# Patient Record
Sex: Female | Born: 2000 | Race: Black or African American | Hispanic: No | Marital: Single | Smoking: Never smoker
Health system: Southern US, Community
[De-identification: ages and names within clinical notes are randomized; demographics above are authoritative.]

## PROBLEM LIST (undated history)

## (undated) DIAGNOSIS — Z789 Other specified health status: Secondary | ICD-10-CM

## (undated) HISTORY — DX: Other specified health status: Z78.9

## (undated) HISTORY — PX: NO PAST SURGERIES: SHX2092

---
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
## 2020-05-31 ENCOUNTER — Encounter: Payer: Self-pay | Admitting: Emergency Medicine

## 2020-05-31 ENCOUNTER — Emergency Department
Admission: EM | Admit: 2020-05-31 | Discharge: 2020-05-31 | Disposition: A | Discharge status: Home / Self Care | Payer: Medicaid Other | Attending: Emergency Medicine | Admitting: Emergency Medicine

## 2020-05-31 ENCOUNTER — Other Ambulatory Visit: Payer: Self-pay

## 2020-05-31 ENCOUNTER — Emergency Department: Payer: Medicaid Other

## 2020-05-31 DIAGNOSIS — O26892 Other specified pregnancy related conditions, second trimester: Secondary | ICD-10-CM | POA: Diagnosis not present

## 2020-05-31 DIAGNOSIS — Z3A14 14 weeks gestation of pregnancy: Secondary | ICD-10-CM

## 2020-05-31 DIAGNOSIS — R102 Pelvic and perineal pain unspecified side: Secondary | ICD-10-CM

## 2020-05-31 LAB — BASIC METABOLIC PANEL
Anion gap: 10 (ref 5–15)
BUN: <5 mg/dL — ABNORMAL LOW (ref 6–20)
CO2: 21 mmol/L — ABNORMAL LOW (ref 22–32)
Calcium: 9.5 mg/dL (ref 8.9–10.3)
Chloride: 106 mmol/L (ref 98–111)
Creatinine, Ser: 0.51 mg/dL (ref 0.44–1.00)
GFR, Estimated: >60 mL/min (ref >60)
Glucose, Bld: 93 mg/dL (ref 70–99)
Potassium: 3.6 mmol/L (ref 3.5–5.1)
Sodium: 137 mmol/L (ref 135–145)

## 2020-05-31 LAB — CBC
HCT: 39.1 % (ref 36.0–46.0)
Hemoglobin: 13.4 g/dL (ref 12.0–15.0)
MCH: 28.3 pg (ref 26.0–34.0)
MCHC: 34.3 g/dL (ref 30.0–36.0)
MCV: 82.7 fL (ref 80.0–100.0)
Platelets: 256 10*3/uL (ref 150–400)
RBC: 4.73 MIL/uL (ref 3.87–5.11)
RDW: 13.3 % (ref 11.5–15.5)
WBC: 13.1 10*3/uL — ABNORMAL HIGH (ref 4.0–10.5)
nRBC: 0 % (ref 0.0–0.2)

## 2020-05-31 LAB — URINALYSIS, COMPLETE (UACMP) WITH MICROSCOPIC
Bacteria, UA: NONE SEEN
Bilirubin Urine: NEGATIVE
Glucose, UA: NEGATIVE mg/dL
Hgb urine dipstick: NEGATIVE
Ketones, ur: NEGATIVE mg/dL
Leukocytes,Ua: NEGATIVE
Nitrite: NEGATIVE
Protein, ur: NEGATIVE mg/dL
Specific Gravity, Urine: 1.014 (ref 1.005–1.030)
pH: 8 (ref 5.0–8.0)

## 2020-05-31 LAB — HCG, QUANTITATIVE, PREGNANCY: hCG, Beta Chain, Quant, S: 78261 m[IU]/mL — ABNORMAL HIGH (ref <5)

## 2020-05-31 LAB — POC URINE PREG, ED: Preg Test, Ur: POSITIVE — AB

## 2020-05-31 NOTE — ED Triage Notes (Signed)
Pt to ED via POV with c/o pelvic pain, pt states is currently pregnant, had a +home preg test, pt states has had no prenatal care. Pt states LMP Sept 23. Pt denies vaginal discharge, vaginal bleeding, pt denies urinary symptoms at this time.

## 2020-05-31 NOTE — ED Triage Notes (Signed)
Pt called to treatment room, no response 

## 2020-05-31 NOTE — ED Provider Notes (Signed)
[facility] Emergency Department Provider Note  ____________________________________________  Time seen: Approximately 8:38 PM  I have reviewed the triage vital signs and the nursing notes.   HISTORY  Chief Complaint Pelvic Pain   HPI Suzanne Wang is a 20 y.o. female presents to the emergency department for treatment and evaluation of pregnancy.  Last menstrual cycle was 02/22/2020.  Patient states that she has had positive home pregnancy test but has not had any prenatal care up to this point.  She denies vaginal bleeding, discharge, or urinary symptoms.  Gravida 1 para 0.  History reviewed. No pertinent past medical history.  There are no problems to display for this patient.   History reviewed. No pertinent surgical history.  Prior to Admission medications   Not on File    Allergies Patient has no known allergies.  No family history on file.  Social History Social History   Tobacco Use  . Smoking status: Never Smoker  . Smokeless tobacco: Never Used  Substance Use Topics  . Alcohol use: Never  . Drug use: Never    Review of Systems Constitutional: Negative for fever. ENT: Negative for sore throat. Respiratory: Negative for cough or shortness of breath Gastrointestinal: No abdominal pain.  No nausea, no vomiting.  No diarrhea.  Musculoskeletal: Negative for generalized body aches. Skin: Negative for rash/lesion/wound. Neurological: Negative for headaches, focal weakness or numbness.  ____________________________________________   PHYSICAL EXAM:  VITAL SIGNS: ED Triage Vitals [05/31/20 1344]  Enc Vitals Group     BP 129/70     Pulse Rate 95     Resp 20     Temp 98.9 F (37.2 C)     Temp Source Oral     SpO2 100 %     Weight 140 lb (63.5 kg)     Height 5\' 4"  (1.626 m)     Head Circumference      Peak Flow      Pain Score 5     Pain Loc      Pain Edu?      Excl. in GC?     Constitutional: Alert and oriented. Well  appearing and in no acute distress. Eyes: Conjunctivae are normal. PERRL. EOMI. Head: Atraumatic. Nose: No congestion/rhinnorhea. Mouth/Throat: Mucous membranes are moist. Neck: No stridor.  Cardiovascular: Normal rate, regular rhythm. Good peripheral circulation. Respiratory: Normal respiratory effort. Musculoskeletal: Full ROM throughout.  Neurologic:  Normal speech and language. No gross focal neurologic deficits are appreciated. Speech is normal. No gait instability. Skin:  Skin is warm, dry and intact. No rash noted. Psychiatric: Mood and affect are normal. Speech and behavior are normal.  ____________________________________________   LABS (all labs ordered are listed, but only abnormal results are displayed)  Labs Reviewed  URINALYSIS, COMPLETE (UACMP) WITH MICROSCOPIC - Abnormal; Notable for the following components:      Result Value   Color, Urine YELLOW (*)    APPearance HAZY (*)    All other components within normal limits  CBC - Abnormal; Notable for the following components:   WBC 13.1 (*)    All other components within normal limits  BASIC METABOLIC PANEL - Abnormal; Notable for the following components:   CO2 21 (*)    BUN <5 (*)    All other components within normal limits  HCG, QUANTITATIVE, PREGNANCY - Abnormal; Notable for the following components:   hCG, Beta Chain, Quant, S 78,261 (*)    All other components within normal limits  POC URINE PREG,  ED - Abnormal; Notable for the following components:   Preg Test, Ur POSITIVE (*)    All other components within normal limits   ____________________________________________  EKG  Not indicated ____________________________________________  RADIOLOGY  Ultrasound shows single IUP fetal heart rate of 157.  14 weeks 1 day.  No maternal abnormality noted. ____________________________________________   PROCEDURES  Not indicated ____________________________________________   INITIAL IMPRESSION /  ASSESSMENT AND PLAN / ED COURSE     Pertinent labs & imaging results that were available during my care of the patient were reviewed by me and considered in my medical decision making (see chart for details).  Patient was advised of her EDC.  She was advised to take prenatal vitamins every night and call tomorrow to schedule appointment with gynecology..  For symptoms of concern if she is unable to see gynecology she is to return to the emergency department. ____________________________________________   FINAL CLINICAL IMPRESSION(S) / ED DIAGNOSES  Final diagnoses:  [redacted] weeks gestation of pregnancy       Chinita Pester, FNP 05/31/20 2041    Shaune Pollack, MD 05/31/20 2042

## 2020-06-01 ENCOUNTER — Telehealth: Payer: Self-pay

## 2020-06-01 NOTE — Telephone Encounter (Signed)
Pt called in and stated that she went to the ED, the pt is 14 weeks. I told the pt I will send a message to the nurse, I have to ask when I can put her on the schedule. The pt verbally understood and verified her number for a call back. Please advise

## 2020-06-02 ENCOUNTER — Telehealth: Payer: Self-pay

## 2020-06-02 NOTE — Telephone Encounter (Signed)
This pt was seen at the ED  On 1/30, the  pt is 14 weeks.  Wanted to ask where I can place this pt you have open 2/8 at 2:30 or 2/9 at 1:30. Please let know know where to place her. Please advise

## 2020-06-02 NOTE — Telephone Encounter (Signed)
Called pt [facility], informed pt that I spoke to Dr. Valentino Saxon and she can see you on 2/3 at 11:30. If that day and time doesn't work please call the office.

## 2020-06-02 NOTE — Telephone Encounter (Signed)
Please speak with Dr. Valentino Saxon to see what she wants to do. She had a 9am this morning that the pt could have token, but its over now.  Thanks [employer]

## 2020-06-02 NOTE — Telephone Encounter (Signed)
I can see her at 11:30 on 2/3.

## 2020-06-04 ENCOUNTER — Encounter: Payer: Medicaid Other | Admitting: Obstetrics and Gynecology

## 2020-06-04 ENCOUNTER — Encounter: Payer: Self-pay | Admitting: Obstetrics and Gynecology

## 2020-06-04 ENCOUNTER — Ambulatory Visit (INDEPENDENT_AMBULATORY_CARE_PROVIDER_SITE_OTHER): Payer: Medicaid Other | Admitting: Obstetrics and Gynecology

## 2020-06-04 ENCOUNTER — Other Ambulatory Visit: Payer: Self-pay

## 2020-06-04 VITALS — BP 120/80 | HR 94 | Wt 161.2 lb

## 2020-06-04 DIAGNOSIS — Z3A15 15 weeks gestation of pregnancy: Secondary | ICD-10-CM

## 2020-06-04 DIAGNOSIS — Z0283 Encounter for blood-alcohol and blood-drug test: Secondary | ICD-10-CM

## 2020-06-04 DIAGNOSIS — O0932 Supervision of pregnancy with insufficient antenatal care, second trimester: Secondary | ICD-10-CM | POA: Insufficient documentation

## 2020-06-04 DIAGNOSIS — Z3402 Encounter for supervision of normal first pregnancy, second trimester: Secondary | ICD-10-CM

## 2020-06-04 DIAGNOSIS — Z113 Encounter for screening for infections with a predominantly sexual mode of transmission: Secondary | ICD-10-CM

## 2020-06-04 LAB — POCT URINALYSIS DIPSTICK OB
Bilirubin, UA: NEGATIVE
Blood, UA: NEGATIVE
Glucose, UA: NEGATIVE
Ketones, UA: NEGATIVE
Leukocytes, UA: NEGATIVE
Nitrite, UA: NEGATIVE
POC,PROTEIN,UA: NEGATIVE
Spec Grav, UA: 1.01 (ref 1.010–1.025)
Urobilinogen, UA: 0.2 E.U./dL
pH, UA: 8 (ref 5.0–8.0)

## 2020-06-04 NOTE — Progress Notes (Signed)
OBSTETRIC INITIAL PRENATAL VISIT  Subjective:    Suzanne Wang is being seen today for her first obstetrical visit and follow up (seen in ER on 06/01/2019, had no complaints, was just concerned as she had a positive pregnancy test and no PNC so far).  This is not a planned pregnancy. She is a 20 y.o. G1P0 female at [redacted]w[redacted]d gestation, Estimated Date of Delivery: 11/26/20 with Patient's last menstrual period was 02/20/2020 (exact date),  consistent with 14 week sono. Her obstetrical history is significant for none. Relationship with FOB: significant other, not living together. He does not plan on being involved. Patient is unsure if she intends to breast feed. Pregnancy history fully reviewed.   OB History  Gravida Para Term Preterm AB Living  1 0 0 0 0 0  SAB IAB Ectopic Multiple Live Births  0 0 0 0 0    # Outcome Date GA Lbr Len/2nd Weight Sex Delivery Anes PTL Lv  1 Current             Gynecologic History:  Last pap smear was: Patient has never had one. Admits history of STIs:: trichomonas in 2021.  Contraception: None   Past Medical History:  Diagnosis Date  . No pertinent past medical history     Family History  Problem Relation Age of Onset  . Hypertension Neg Hx   . Diabetes Neg Hx   . Cancer Neg Hx     Past Surgical History:  Procedure Laterality Date  . NO PAST SURGERIES      Social History   Socioeconomic History  . Marital status: Single    Spouse name: Not on file  . Number of children: Not on file  . Years of education: 67  . Highest education level: High school graduate  Occupational History  . Occupation: retail  Tobacco Use  . Smoking status: Never Smoker  . Smokeless tobacco: Never Used  Substance and Sexual Activity  . Alcohol use: Never  . Drug use: Never  . Sexual activity: Yes    Partners: Male    Birth control/protection: None  Other Topics Concern  . Not on file  Social History Narrative  . Not on file   Social Determinants of  Health   Financial Resource Strain: Not on file  Food Insecurity: Not on file  Transportation Needs: Not on file  Physical Activity: Not on file  Stress: Not on file  Social Connections: Not on file  Intimate Partner Violence: Not on file    Current Outpatient Medications on File Prior to Visit  Medication Sig Dispense Refill  . Prenatal Vit-Fe Fumarate-FA (PRENATAL VITAMINS PO) Take by mouth.     No current facility-administered medications on file prior to visit.    No Known Allergies   Review of Systems General: Not Present- Fever, Weight Loss and Weight Gain. Skin: Not Present- Rash. HEENT: Not Present- Blurred Vision, Headache and Bleeding Gums. Respiratory: Not Present- Difficulty Breathing. Breast: Not Present- Breast Mass. Cardiovascular: Not Present- Chest Pain, Elevated Blood Pressure, Fainting / Blacking Out and Shortness of Breath. Gastrointestinal: Not Present- Abdominal Pain, Constipation, Positive for Nausea and Vomiting- occasional. Female Genitourinary: Not Present- Frequency, Painful Urination, Pelvic Pain, Vaginal Bleeding, Vaginal Discharge, Contractions, regular, Fetal Movements Decreased, Urinary Complaints and Vaginal Fluid. Musculoskeletal: Not Present- Back Pain and Leg Cramps. Neurological: Not Present- Dizziness. Psychiatric: Not Present- Depression.     Objective:   Blood pressure 120/80, pulse 94, weight 161 lb 3.2 oz (73.1 kg), last  menstrual period 02/20/2020.  Body mass index is 27.67 kg/m.  General Appearance:    Alert, cooperative, no distress, appears stated age  Head:    Normocephalic, without obvious abnormality, atraumatic  Eyes:    PERRL, conjunctiva/corneas clear, EOM's intact, both eyes  Ears:    Normal external ear canals, both ears  Nose:   Nares normal, septum midline, mucosa normal, no drainage or sinus tenderness  Throat:   Lips, mucosa, and tongue normal; teeth and gums normal  Neck:   Supple, symmetrical, trachea midline,  no adenopathy; thyroid: no enlargement/tenderness/nodules; no carotid bruit or JVD  Back:     Symmetric, no curvature, ROM normal, no CVA tenderness  Lungs:     Clear to auscultation bilaterally, respirations unlabored  Chest Wall:    No tenderness or deformity   Heart:    Regular rate and rhythm, S1 and S2 normal, no murmur, rub or gallop  Breast Exam:    No tenderness, masses, or nipple abnormality  Abdomen:     Soft, non-tender, bowel sounds active all four quadrants, no masses, no organomegaly.  FH 15.  FHT 152 bpm.  Genitalia:    Pelvic:external genitalia normal, vagina without lesions, discharge, or tenderness, rectovaginal septum  normal. Cervix normal in appearance, no cervical motion tenderness, no adnexal masses or tenderness.  Pregnancy positive findings: uterine enlargement: 15 wk size, nontender.   Rectal:    Normal external sphincter.  No hemorrhoids appreciated. Internal exam not done.   Extremities:   Extremities normal, atraumatic, no cyanosis or edema  Pulses:   2+ and symmetric all extremities  Skin:   Skin color, texture, turgor normal, no rashes or lesions  Lymph nodes:   Cervical, supraclavicular, and axillary nodes normal  Neurologic:   CNII-XII intact, normal strength, sensation and reflexes throughout     Assessment:   1. Encounter for supervision of normal first pregnancy in second trimester   2. [redacted] weeks gestation of pregnancy   3. Encounter for drug screening   4. Screening examination for STD (sexually transmitted disease)   5. Late prenatal care affecting pregnancy in second trimester     Plan:   1. Supervision of normal pregnancy  - Initial labs ordered. - Prenatal vitamins encouraged. - Problem list reviewed and updated. - New OB counseling:  The patient has been given an overview regarding routine prenatal care.  Recommendations regarding diet, weight gain, and exercise in pregnancy were given. - Prenatal testing, optional genetic testing, and  ultrasound use in pregnancy were reviewed.  NIPT testing desired. Panorama/Horizon done.  - Discussed self-help OTC remedies for mild nausea and vomiting.  - Benefits of Breast Feeding were discussed. The patient is encouraged to consider nursing her baby post partum. - Anatomy scan scheduled for next visit.   2. Late entry to prenatal care - Patient reports issues with transportation. Also notes missing appointment for a court date.  Discussed with Medicaid, can arrange for transportation.    Follow up in 4 weeks.   The patient has Medicaid.  [facility] Medicaid Risk Screening Form completed today   Hildred Laser, MD [facility]

## 2020-06-04 NOTE — Patient Instructions (Addendum)
WHAT OB PATIENTS CAN EXPECT   Confirmation of pregnancy and ultrasound ordered if medically indicated-[redacted] weeks gestation  New OB (NOB) intake with nurse and New OB (NOB) labs- [redacted] weeks gestation  New OB (NOB) physical examination with provider- 11/[redacted] weeks gestation  Flu vaccine-[redacted] weeks gestation  Anatomy scan-[redacted] weeks gestation  Glucose tolerance test, blood work to test for anemia, T-dap vaccine-[redacted] weeks gestation  Vaginal swabs/cultures-STD/Group B strep-[redacted] weeks gestation  Appointments every 4 weeks until 28 weeks  Every 2 weeks from 28 weeks until 36 weeks  Weekly visits from 36 weeks until delivery   Second Trimester of Pregnancy  The second trimester of pregnancy is from week 13 through week 27. This is also called months 4 through 6 of pregnancy. This is often the time when you feel your best. During the second trimester:  Morning sickness is less or has stopped.  You may have more energy.  You may feel hungry more often. At this time, your unborn baby (fetus) is growing very fast. At the end of the sixth month, the unborn baby may be up to 12 inches long and weigh about 1 pounds. You will likely start to feel the baby move between 16 and 20 weeks of pregnancy. Body changes during your second trimester Your body continues to go through many changes during this time. The changes vary and generally return to normal after the baby is born. Physical changes  You will gain more weight.  You may start to get stretch marks on your hips, belly (abdomen), and breasts.  Your breasts will grow and may hurt.  Dark spots or blotches may develop on your face.  A dark line from your belly button to the pubic area (linea nigra) may appear.  You may have changes in your hair. Health changes  You may have headaches.  You may have heartburn.  You may have trouble pooping (constipation).  You may have hemorrhoids or swollen, bulging veins (varicose veins).  Your  gums may bleed.  You may pee (urinate) more often.  You may have back pain. Follow these instructions at home: Medicines  Take over-the-counter and prescription medicines only as told by your doctor. Some medicines are not safe during pregnancy.  Take a prenatal vitamin that contains at least 600 micrograms (mcg) of folic acid. Eating and drinking  Eat healthy meals that include: ? Fresh fruits and vegetables. ? Whole grains. ? Good sources of protein, such as meat, eggs, or tofu. ? Low-fat dairy products.  Avoid raw meat and unpasteurized juice, milk, and cheese.  You may need to take these actions to prevent or treat trouble pooping: ? Drink enough fluids to keep your pee (urine) pale yellow. ? Eat foods that are high in fiber. These include beans, whole grains, and fresh fruits and vegetables. ? Limit foods that are high in fat and sugar. These include fried or sweet foods. Activity  Exercise only as told by your doctor. Most people can do their usual exercise during pregnancy. Try to exercise for 30 minutes at least 5 days a week.  Stop exercising if you have pain or cramps in your belly or lower back.  Do not exercise if it is too hot or too humid, or if you are in a place of great height (high altitude).  Avoid heavy lifting.  If you choose to, you may have sex unless your doctor tells you not to. Relieving pain and discomfort  Wear a good support bra if  your breasts are sore.  Take warm water baths (sitz baths) to soothe pain or discomfort caused by hemorrhoids. Use hemorrhoid cream if your doctor approves.  Rest with your legs raised (elevated) if you have leg cramps or low back pain.  If you develop bulging veins in your legs: ? Wear support hose as told by your doctor. ? Raise your feet for 15 minutes, 3-4 times a day. ? Limit salt in your food. Safety  Wear your seat belt at all times when you are in a car.  Talk with your doctor if someone is hurting  you or yelling at you a lot. Lifestyle  Do not use hot tubs, steam rooms, or saunas.  Do not douche. Do not use tampons or scented sanitary pads.  Avoid cat litter boxes and soil used by cats. These carry germs that can harm your baby and can cause a loss of your baby by miscarriage or stillbirth.  Do not use herbal medicines, illegal drugs, or medicines that are not approved by your doctor. Do not drink alcohol.  Do not smoke or use any products that contain nicotine or tobacco. If you need help quitting, ask your doctor. General instructions  Keep all follow-up visits. This is important.  Ask your doctor about local prenatal classes.  Ask your doctor about the right foods to eat or for help finding a counselor. Where to find more information  American Pregnancy Association: americanpregnancy.org  [employer]: www.acog.org  Office on [employer]: KeywordPortfolios.com.br Contact a doctor if:  You have a headache that does not go away when you take medicine.  You have changes in how you see, or you see spots in front of your eyes.  You have mild cramps, pressure, or pain in your lower belly.  You continue to feel like you may vomit (nauseous), you vomit, or you have watery poop (diarrhea).  You have bad-smelling fluid coming from your vagina.  You have pain when you pee or your pee smells bad.  You have very bad swelling of your face, hands, ankles, feet, or legs.  You have a fever. Get help right away if:  You are leaking fluid from your vagina.  You have spotting or bleeding from your vagina.  You have very bad belly cramping or pain.  You have trouble breathing.  You have chest pain.  You faint.  You have not felt your baby move for the time period told by your doctor.  You have new or increased pain, swelling, or redness in an arm or leg. Summary  The second trimester of pregnancy is from week 13 through  week 27 (months 4 through 6).  Eat healthy meals.  Exercise as told by your doctor. Most people can do their usual exercise during pregnancy.  Do not use herbal medicines, illegal drugs, or medicines that are not approved by your doctor. Do not drink alcohol.  Call your doctor if you get sick or if you notice anything unusual about your pregnancy. This information is not intended to replace advice given to you by your health care provider. Make sure you discuss any questions you have with your health care provider. Document Revised: 09/25/2019 Document Reviewed: 08/01/2019 Elsevier Patient Education  2021 [employer]. How a Baby Grows During Pregnancy Pregnancy begins when a female's sperm enters a female's egg. This is called fertilization. Fertilization usually happens in one of the fallopian tubes that connect the ovaries to the uterus. The fertilized egg  moves down the fallopian tube to the uterus. Once it reaches the uterus, it implants into the lining of the uterus and begins to grow. For the first 8 weeks, the fertilized egg is called an embryo. After 8 weeks, it is called a fetus. As the fetus continues to grow, it receives oxygen and nutrients through the placenta, which is an organ that grows to support the developing baby. The placenta is the life support system for the baby. It provides oxygen and nutrition and removes waste. How long does a typical pregnancy last? A pregnancy usually lasts 280 days, or about 40 weeks. Pregnancy is divided into three periods of growth, also called trimesters:  First trimester: 0-12 weeks.  Second trimester: 13-27 weeks.  Third trimester: 28-40 weeks. The day when your baby is ready to be born (full term) is your estimated date of delivery. However, most babies are not born on their estimated date of delivery. How does my baby develop month by month? First month  The fertilized egg attaches to the inside of the uterus.  Some cells will form  the placenta. Others will form the fetus.  The arms, legs, brain, spinal cord, lungs, and heart begin to develop.  At the end of the first month, the heart begins to beat. Second month  The bones, inner ear, eyelids, hands, and feet form.  The genitals develop.  By the end of 8 weeks, all major organs are developing. Third month  All of the internal organs are forming.  Teeth develop below the gums.  Bones and muscles begin to grow. The spine can flex.  The skin is transparent.  Fingernails and toenails begin to form.  Arms and legs continue to grow longer, and hands and feet develop.  The fetus is about 3 inches (7.6 cm) long. Fourth month  The placenta is completely formed.  The external sex organs, neck, outer ear, eyebrows, eyelids, and fingernails are formed.  The fetus can hear, swallow, and move its arms and legs.  The kidneys begin to produce urine.  The skin is covered with a white, waxy coating (vernix) and very fine hair (lanugo). Fifth month  The fetus moves around more and can be felt for the first time (quickening).  The fetus starts to sleep and wake up and may begin to suck a finger.  The nails grow to the end of the fingers.  The organ in the digestive system that makes bile (gallbladder) functions and helps to digest nutrients.  If the fetus is a female, eggs are present in the ovaries. If the fetus is a female, testicles start to move down into the scrotum. Sixth month  The lungs are formed.  The eyes open. The brain continues to develop.  Your baby has fingerprints and toe prints. Your baby's hair grows thicker.  At the end of the second trimester, the fetus is about 9 inches (22.9 cm) long. Seventh month  The fetus kicks and stretches.  The eyes are developed enough to sense changes in light.  The hands can make a grasping motion.  The fetus responds to sound. Eighth month  Most organs and body systems are fully developed and  functioning.  Bones harden, and taste buds develop. The fetus may hiccup.  Certain areas of the brain are still developing. The skull remains soft. Ninth month  The fetus gains about  lb (0.23 kg) each week.  The lungs are fully developed.  Patterns of sleep develop.  The fetus's head  typically moves into a head-down position (vertex) in the uterus to prepare for birth.  The fetus weighs 6-9 lb (2.72-4.08 kg) and is 19-20 inches (48.26-50.8 cm) long.   How do I know if my baby is developing well? Always talk with your health care provider about any concerns that you may have about your pregnancy and your baby. At each prenatal visit, your health care provider will do several different tests to check on your health and keep track of your baby's development. These include:  Fundal height and position. To do this, your health care provider will: ? Measure your growing belly from your pubic bone to the top of the uterus using a tape measure. ? Feel your belly to determine your baby's position.  Heartbeat. An ultrasound in the first trimester can confirm pregnancy and show a heartbeat, depending on how far along you are. Your health care provider will check your baby's heart rate at every prenatal visit. You will also have a second trimester ultrasound to check your baby's development. Follow these instructions at home:  Take prenatal vitamins as told by your health care provider. These include vitamins such as folic acid, iron, calcium, and vitamin D. They are important for healthy development.  Take over-the-counter and prescription medicines only as told by your health care provider.  Keep all follow-up visits. This is important. Follow-up visits include prenatal care and screening tests. Summary  A pregnancy usually lasts 280 days, or about 40 weeks. Pregnancy is divided into three periods of growth, also called trimesters.  Your health care provider will monitor your baby's  growth and development throughout your pregnancy.  Follow your health care provider's recommendations about taking prenatal vitamins and medicines during your pregnancy.  Talk with your health care provider if you have any concerns about your pregnancy or your developing baby. This information is not intended to replace advice given to you by your health care provider. Make sure you discuss any questions you have with your health care provider. Document Revised: 09/25/2019 Document Reviewed: 08/01/2019 Elsevier Patient Education  2021 [employer]. Commonly Asked Questions During Pregnancy  Cats: A parasite can be excreted in cat feces.  To avoid exposure you need to have another person empty the little box.  If you must empty the litter box you will need to wear gloves.  Wash your hands after handling your cat.  This parasite can also be found in raw or undercooked meat so this should also be avoided.  Colds, Sore Throats, Flu: Please check your medication sheet to see what you can take for symptoms.  If your symptoms are unrelieved by these medications please call the office.  Dental Work: Most any dental work Investment banker, corporate recommends is permitted.  X-rays should only be taken during the first trimester if absolutely necessary.  Your abdomen should be shielded with a lead apron during all x-rays.  Please notify your provider prior to receiving any x-rays.  Novocaine is fine; gas is not recommended.  If your dentist requires a note from [place] prior to dental work please call the office and we will provide one for you.  Exercise: Exercise is an important part of staying healthy during your pregnancy.  You may continue most exercises you were accustomed to prior to pregnancy.  Later in your pregnancy you will most likely notice you have difficulty with activities requiring balance like riding a bicycle.  It is important that you listen to your body and avoid activities that put  you at a higher risk of  falling.  Adequate rest and staying well hydrated are a must!  If you have questions about the safety of specific activities ask your provider.    Exposure to Children with illness: Try to avoid obvious exposure; report any symptoms to [place] when noted,  If you have chicken pos, red measles or mumps, you should be immune to these diseases.   Please do not take any vaccines while pregnant unless you have checked with your OB provider.  Fetal Movement: After 28 weeks we recommend you do "kick counts" twice daily.  Lie or sit down in a calm quiet environment and count your baby movements "kicks".  You should feel your baby at least 10 times per hour.  If you have not felt 10 kicks within the first hour get up, walk around and have something sweet to eat or drink then repeat for an additional hour.  If count remains less than 10 per hour notify your provider.  Fumigating: Follow your pest control agent's advice as to how long to stay out of your home.  Ventilate the area well before re-entering.  Hemorrhoids:   Most over-the-counter preparations can be used during pregnancy.  Check your medication to see what is safe to use.  It is important to use a stool softener or fiber in your diet and to drink lots of liquids.  If hemorrhoids seem to be getting worse please call the office.   Hot Tubs:  Hot tubs Jacuzzis and saunas are not recommended while pregnant.  These increase your internal body temperature and should be avoided.  Intercourse:  Sexual intercourse is safe during pregnancy as long as you are comfortable, unless otherwise advised by your provider.  Spotting may occur after intercourse; report any bright red bleeding that is heavier than spotting.  Labor:  If you know that you are in labor, please go to the hospital.  If you are unsure, please call the office and let us help you decide what to do.  Lifting, straining, etc:  If your job requires heavy lifting or straining please check with your  provider for any limitations.  Generally, you should not lift items heavier than that you can lift simply with your hands and arms (no back muscles)  Painting:  Paint fumes do not harm your pregnancy, but may make you ill and should be avoided if possible.  Latex or water based paints have less odor than oils.  Use adequate ventilation while painting.  Permanents & Hair Color:  Chemicals in hair dyes are not recommended as they cause increase hair dryness which can increase hair loss during pregnancy.  " Highlighting" and permanents are allowed.  Dye may be absorbed differently and permanents may not hold as well during pregnancy.  Sunbathing:  Use a sunscreen, as skin burns easily during pregnancy.  Drink plenty of fluids; avoid over heating.  Tanning Beds:  Because their possible side effects are still unknown, tanning beds are not recommended.  Ultrasound Scans:  Routine ultrasounds are performed at approximately 20 weeks.  You will be able to see your baby's general anatomy an if you would like to know the gender this can usually be determined as well.  If it is questionable when you conceived you may also receive an ultrasound early in your pregnancy for dating purposes.  Otherwise ultrasound exams are not routinely performed unless there is a medical necessity.  Although you can request a scan we ask that you  pay for it when conducted because insurance does not cover " patient request" scans.  Work: If your pregnancy proceeds without complications you may work until your due date, unless your physician or employer advises otherwise.  Round Ligament Pain/Pelvic Discomfort:  Sharp, shooting pains not associated with bleeding are fairly common, usually occurring in the second trimester of pregnancy.  They tend to be worse when standing up or when you remain standing for long periods of time.  These are the result of pressure of certain pelvic ligaments called "round ligaments".  Rest, Tylenol and  heat seem to be the most effective relief.  As the womb and fetus grow, they rise out of the pelvis and the discomfort improves.  Please notify the office if your pain seems different than that described.  It may represent a more serious condition.  Common Medications Safe in Pregnancy  Acne:      Constipation:  Benzoyl Peroxide     Colace  Clindamycin      Dulcolax Suppository  Topica Erythromycin     Fibercon  Salicylic Acid      Metamucil         Miralax AVOID:        Senakot   Accutane    Cough:  Retin-A       Cough Drops  Tetracycline      Phenergan w/ Codeine if Rx  Minocycline      Robitussin (Plain & DM)  Antibiotics:     Crabs/Lice:  Ceclor       RID  Cephalosporins    AVOID:  E-Mycins      Kwell  Keflex  Macrobid/Macrodantin   Diarrhea:  Penicillin      Kao-Pectate  Zithromax      Imodium AD         PUSH FLUIDS AVOID:       Cipro     Fever:  Tetracycline      Tylenol (Regular or Extra  Minocycline       Strength)  Levaquin      Extra Strength-Do not          Exceed 8 tabs/24 hrs Caffeine:        <29m/day (equiv. To 1 cup of coffee or  approx. 3 12 oz sodas)         Gas: Cold/Hayfever:       Gas-X  Benadryl      Mylicon  Claritin       Phazyme  **Claritin-D        Chlor-Trimeton    Headaches:  Dimetapp      ASA-Free Excedrin  Drixoral-Non-Drowsy     Cold Compress  Mucinex (Guaifenasin)     Tylenol (Regular or Extra  Sudafed/Sudafed-12 Hour     Strength)  **Sudafed PE Pseudoephedrine   Tylenol Cold & Sinus     Vicks Vapor Rub  Zyrtec  **AVOID if Problems With Blood Pressure         Heartburn: Avoid lying down for at least 1 hour after meals  Aciphex      Maalox     Rash:  Milk of Magnesia     Benadryl    Mylanta       1% Hydrocortisone Cream  Pepcid  Pepcid Complete   Sleep Aids:  Prevacid      Ambien   Prilosec       Benadryl  Rolaids       Chamomile Tea  Tums (Limit 4/day)  Unisom         Tylenol PM         Warm milk-add vanilla  or  Hemorrhoids:       Sugar for taste  Anusol/Anusol H.C.  (RX: Analapram 2.5%)  Sugar Substitutes:  Hydrocortisone OTC     Ok in moderation  Preparation H      Tucks        Vaseline lotion applied to tissue with wiping    Herpes:     Throat:  Acyclovir      Oragel  Famvir  Valtrex     Vaccines:         Flu Shot Leg Cramps:       *Gardasil  Benadryl      Hepatitis A         Hepatitis B Nasal Spray:       Pneumovax  Saline Nasal Spray     Polio Booster         Tetanus Nausea:       Tuberculosis test or PPD  Vitamin B6 25 mg TID   AVOID:    Dramamine      *Gardasil  Emetrol       Live Poliovirus  Ginger Root 250 mg QID    MMR (measles, mumps &  High Complex Carbs @ Bedtime    rebella)  Sea Bands-Accupressure    Varicella (Chickenpox)  Unisom 1/2 tab TID     *No known complications           If received before Pain:         Known pregnancy;   Darvocet       Resume series after  Lortab        Delivery  Percocet    Yeast:   Tramadol      Femstat  Tylenol 3      Gyne-lotrimin  Ultram       Monistat  Vicodin           MISC:         All Sunscreens           Hair Coloring/highlights          Insect Repellant's          (Including DEET)         Mystic Tans

## 2020-06-04 NOTE — Progress Notes (Signed)
NOB-Pt present for follow up from ED for pregnancy. Pt is currently [redacted]w[redacted]d. Pt stated that she was doing well no problems.

## 2020-06-05 LAB — URINALYSIS, ROUTINE W REFLEX MICROSCOPIC
Bilirubin, UA: NEGATIVE
Glucose, UA: NEGATIVE
Ketones, UA: NEGATIVE
Leukocytes,UA: NEGATIVE
Nitrite, UA: NEGATIVE
Protein,UA: NEGATIVE
RBC, UA: NEGATIVE
Specific Gravity, UA: 1.016 (ref 1.005–1.030)
Urobilinogen, Ur: 0.2 mg/dL (ref 0.2–1.0)
pH, UA: 8.5 — ABNORMAL HIGH (ref 5.0–7.5)

## 2020-06-05 LAB — TOXOPLASMA ANTIBODIES- IGG AND  IGM
Toxoplasma Antibody- IgM: <3 AU/mL (ref 0.0–7.9)
Toxoplasma IgG Ratio: <3 IU/mL (ref 0.0–7.1)

## 2020-06-05 LAB — HGB SOLU + RFLX FRAC: Sickle Solubility Test - HGBRFX: NEGATIVE

## 2020-06-05 LAB — VIRAL HEPATITIS HBV, HCV
HCV Ab: <0.1 s/co ratio (ref 0.0–0.9)
Hep B Core Total Ab: NEGATIVE
Hep B Surface Ab, Qual: NONREACTIVE
Hepatitis B Surface Ag: NEGATIVE

## 2020-06-05 LAB — NICOTINE SCREEN, URINE: Cotinine Ql Scrn, Ur: NEGATIVE ng/mL

## 2020-06-05 LAB — DRUG PROFILE, UR, 9 DRUGS (LABCORP)
Amphetamines, Urine: NEGATIVE ng/mL
Barbiturate Quant, Ur: NEGATIVE ng/mL
Benzodiazepine Quant, Ur: NEGATIVE ng/mL
Cannabinoid Quant, Ur: NEGATIVE ng/mL
Cocaine (Metab.): NEGATIVE ng/mL
Methadone Screen, Urine: NEGATIVE ng/mL
Opiate Quant, Ur: NEGATIVE ng/mL
PCP Quant, Ur: NEGATIVE ng/mL
Propoxyphene: NEGATIVE ng/mL

## 2020-06-05 LAB — RPR: RPR Ser Ql: NONREACTIVE

## 2020-06-05 LAB — VARICELLA ZOSTER ANTIBODY, IGG: Varicella zoster IgG: 2868 index (ref >165)

## 2020-06-05 LAB — HEPATITIS C ANTIBODY: Hep C Virus Ab: <0.1 s/co ratio (ref 0.0–0.9)

## 2020-06-05 LAB — ABO AND RH: Rh Factor: POSITIVE

## 2020-06-05 LAB — RUBELLA SCREEN: Rubella Antibodies, IGG: 12.8 index (ref >0.99)

## 2020-06-05 LAB — HIV ANTIBODY (ROUTINE TESTING W REFLEX): HIV Screen 4th Generation wRfx: NONREACTIVE

## 2020-06-05 LAB — HCV INTERPRETATION

## 2020-06-06 LAB — GC/CHLAMYDIA PROBE AMP
Chlamydia trachomatis, NAA: NEGATIVE
Neisseria Gonorrhoeae by PCR: NEGATIVE

## 2020-06-07 LAB — URINE CULTURE, OB REFLEX

## 2020-06-07 LAB — CULTURE, OB URINE

## 2020-07-02 ENCOUNTER — Other Ambulatory Visit: Payer: Self-pay

## 2020-07-02 ENCOUNTER — Other Ambulatory Visit: Payer: Medicaid Other

## 2020-07-02 ENCOUNTER — Ambulatory Visit: Admission: RE | Admit: 2020-07-02 | Payer: Medicaid Other | Source: Ambulatory Visit

## 2020-07-02 ENCOUNTER — Encounter: Payer: Self-pay | Admitting: Obstetrics and Gynecology

## 2020-07-02 ENCOUNTER — Ambulatory Visit (INDEPENDENT_AMBULATORY_CARE_PROVIDER_SITE_OTHER): Payer: Medicaid Other | Admitting: Obstetrics and Gynecology

## 2020-07-02 VITALS — BP 100/64 | HR 87 | Wt 163.3 lb

## 2020-07-02 DIAGNOSIS — Z3402 Encounter for supervision of normal first pregnancy, second trimester: Secondary | ICD-10-CM

## 2020-07-02 DIAGNOSIS — Z3A19 19 weeks gestation of pregnancy: Secondary | ICD-10-CM | POA: Diagnosis not present

## 2020-07-02 LAB — POCT URINALYSIS DIPSTICK OB
Bilirubin, UA: NEGATIVE
Blood, UA: NEGATIVE
Glucose, UA: NEGATIVE
Ketones, UA: NEGATIVE
Leukocytes, UA: NEGATIVE
Nitrite, UA: NEGATIVE
Odor: NEGATIVE
POC,PROTEIN,UA: NEGATIVE
Spec Grav, UA: 1.01 (ref 1.010–1.025)
Urobilinogen, UA: 0.2 E.U./dL
pH, UA: 6.5 (ref 5.0–8.0)

## 2020-07-02 NOTE — Progress Notes (Signed)
ROB- NO COMPLAINTS. Anatomy scheduled today at 4pm - [facility]. Pt may r/s. Number given.

## 2020-07-02 NOTE — Progress Notes (Signed)
ROB: No complaints.  Reports fetal movement.  FAS rescheduled.

## 2020-07-06 ENCOUNTER — Telehealth: Payer: Self-pay | Admitting: Obstetrics and Gynecology

## 2020-07-06 NOTE — Telephone Encounter (Signed)
LM for pt to call back so we can schedule her anatomy scan- she no showed previous scan that had been scheduled at [facility]

## 2020-07-24 ENCOUNTER — Ambulatory Visit
Admission: RE | Admit: 2020-07-24 | Discharge: 2020-07-24 | Disposition: A | Discharge status: Home / Self Care | Payer: Medicaid Other | Source: Ambulatory Visit | Attending: Obstetrics and Gynecology | Admitting: Obstetrics and Gynecology

## 2020-07-24 ENCOUNTER — Other Ambulatory Visit: Payer: Self-pay

## 2020-07-24 DIAGNOSIS — Z3402 Encounter for supervision of normal first pregnancy, second trimester: Secondary | ICD-10-CM | POA: Diagnosis present

## 2020-07-24 IMAGING — US US OB COMP +14 WK
1 series · 15 of 28 positions shown · non-contrast
Comparison: none

CLINICAL DATA: Fetal anatomic survey

EXAM:
OBSTETRICAL ULTRASOUND >14 WKS

[Series 1: us ob comp + 14 wk · 15 of 90 slices shown]
[im 1/90]
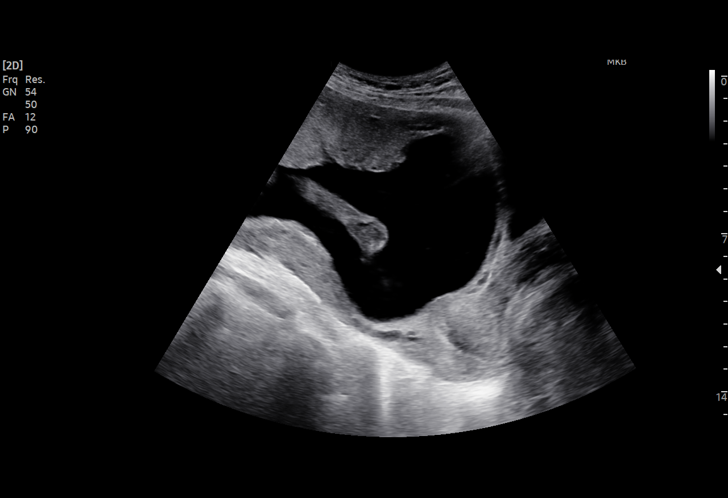
[im 7/90]
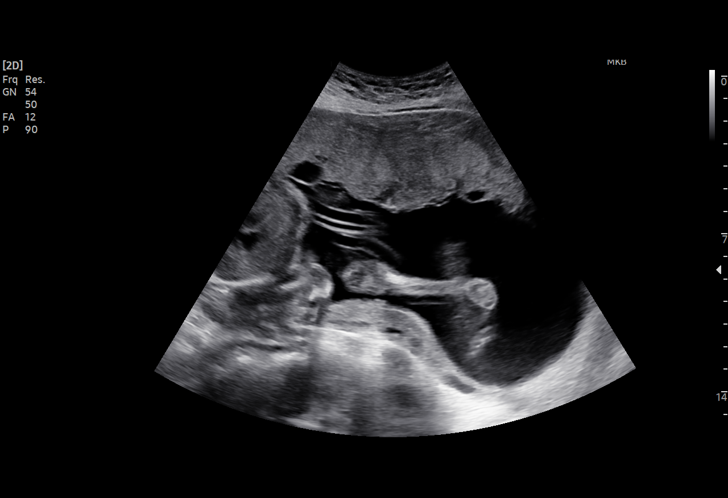
[im 14/90]
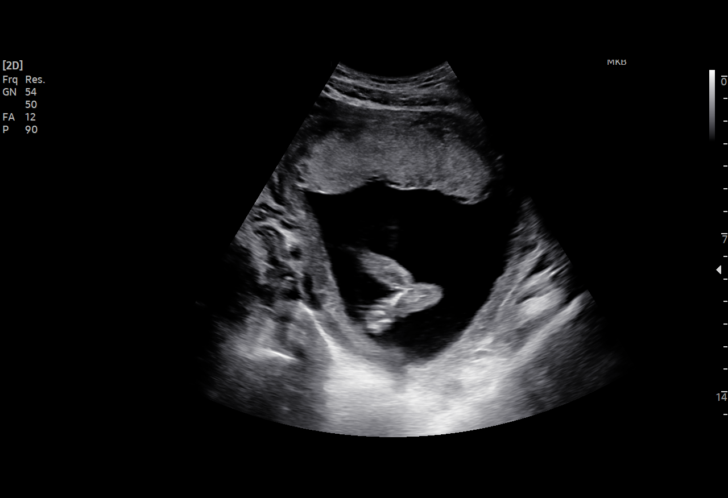
[im 20/90]
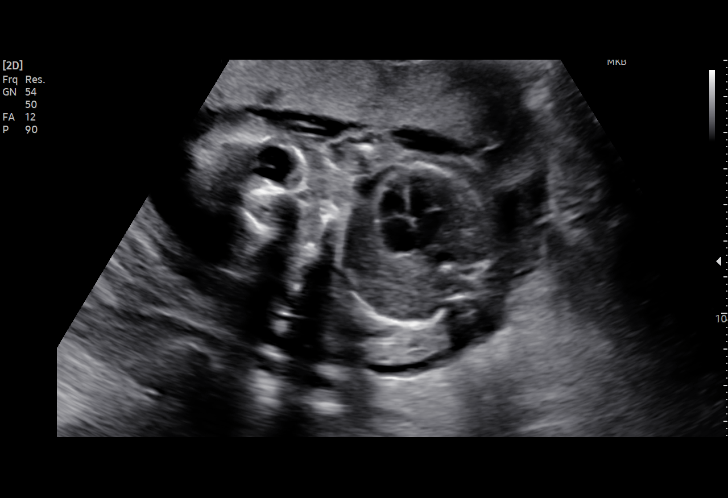
[im 27/90]
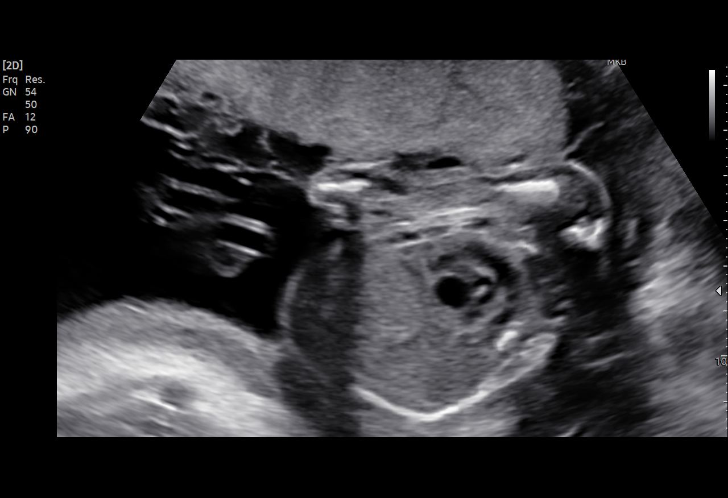
[im 33/90]
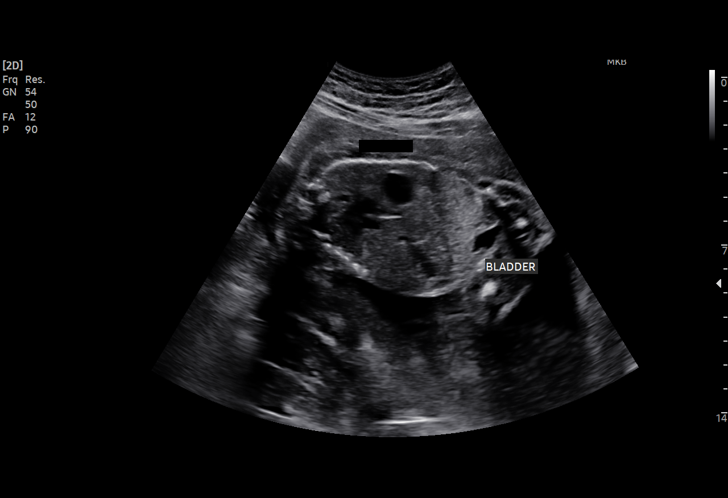
[im 40/90]
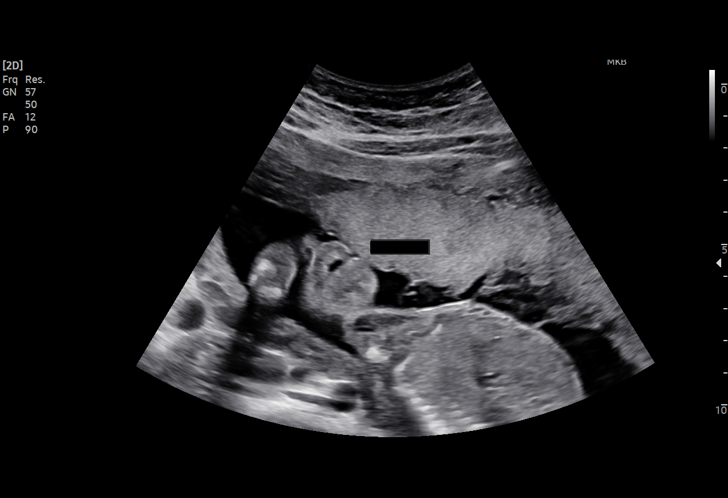
[im 47/90]
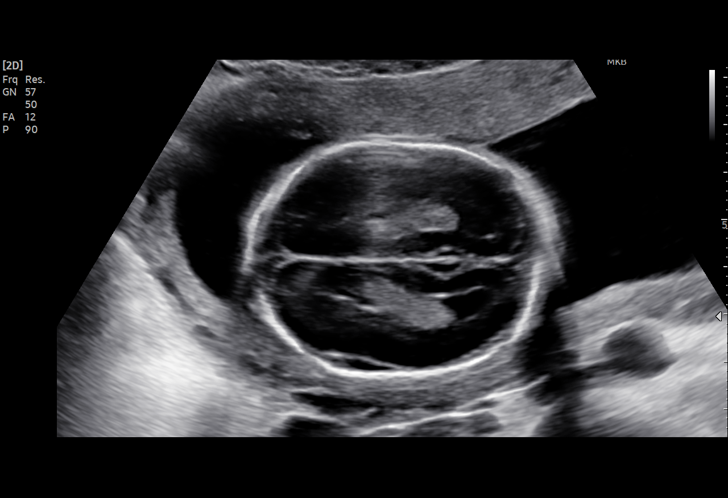
[im 50/90]
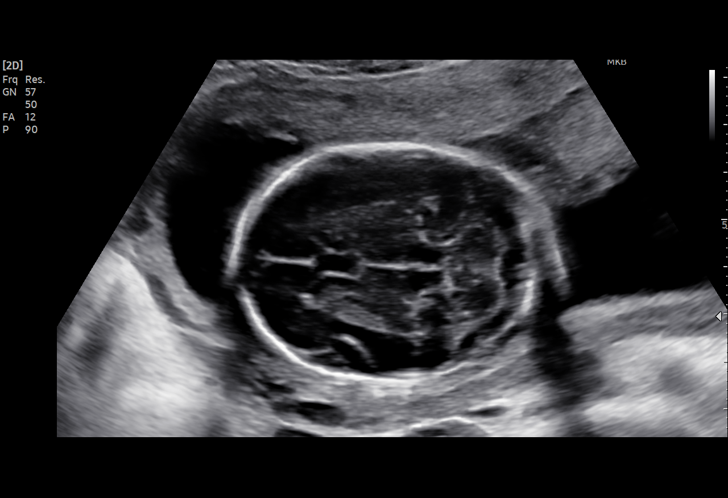
[im 57/90]
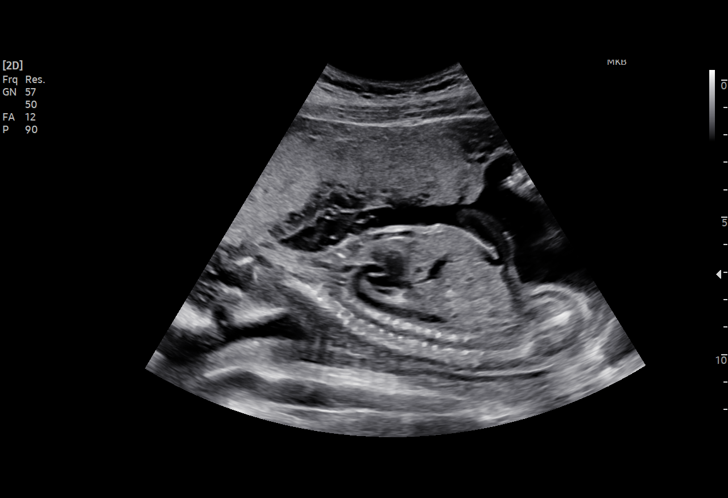
[im 63/90]
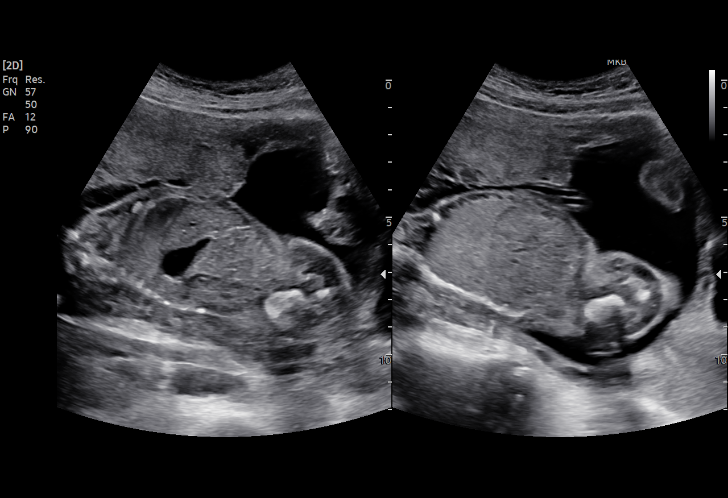
[im 70/90]
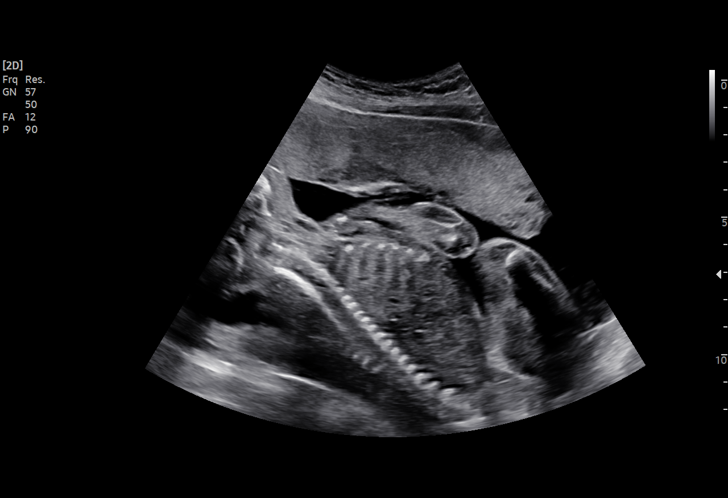
[im 76/90]
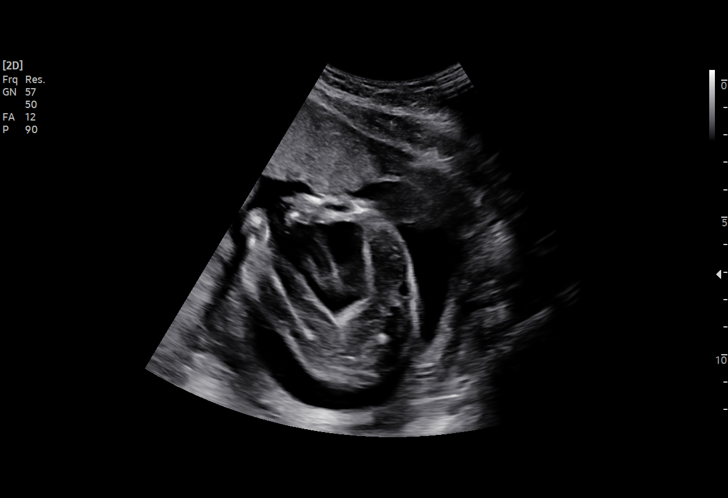
[im 83/90]
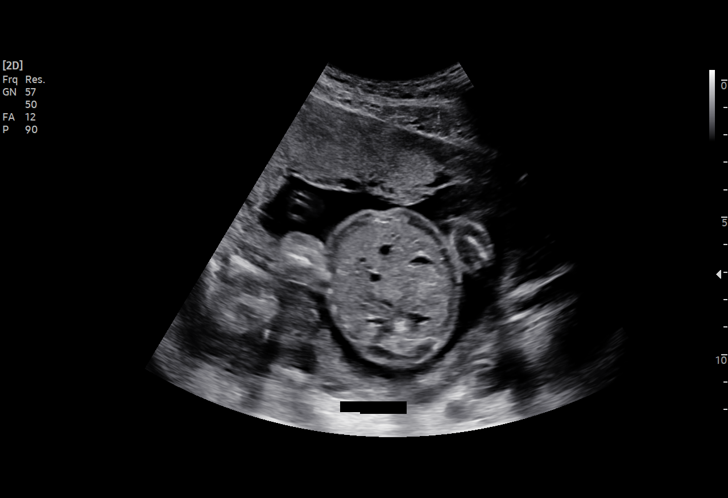
[im 90/90]
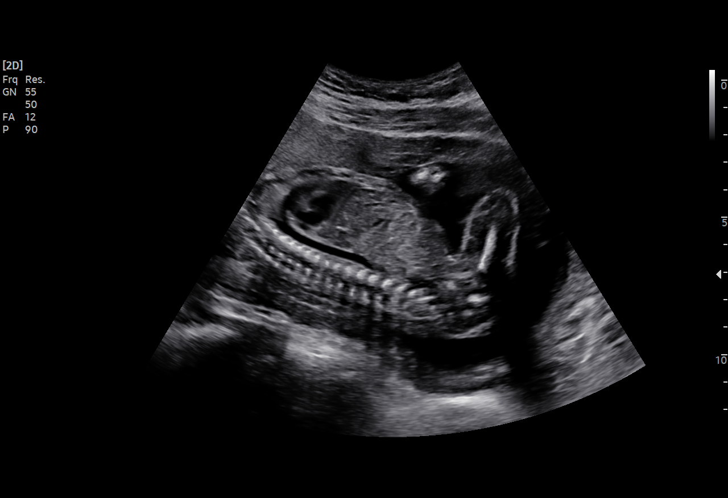

[15 of 28 positions shown; findings below may reference images not displayed]

FINDINGS: Number of Fetuses: 1

Heart Rate:  153 bpm

Movement: Yes

Presentation: Breech

Previa: No

Placental Location: Anterior

Amniotic Fluid (Subjective): Normal

Amniotic Fluid (Objective):

Vertical pocket = 5.2cm

FETAL BIOMETRY

BPD: 5.0cm 21w 0d

HC:   19.7cm 21w 6d

AC:   16.9cm 21w 6d

FL:   3.7cm 21w 5d

Current Mean GA: 21w 6d US EDC: 11/28/2020

Assigned GA:  21w 6d Assigned EDC: 11/28/2020

FETAL ANATOMY

Lateral Ventricles: Appears normal

Thalami/CSP: Appears normal

Posterior Fossa:  Appears normal

Nuchal Region: Appears normal   NFT= N/A > 20 WKS

Upper Lip: Appears normal

Spine: Limited views appear normal

4 Chamber Heart on Left: Appears normal

LVOT: Appears normal

RVOT: Appears normal

Stomach on Left: Appears normal

3 Vessel Cord: Appears normal

Cord Insertion site: Appears normal

Kidneys: Appears normal

Bladder: Appears normal

Extremities: Appears normal

Technically difficult due to: Fetal position

Maternal Findings:

Cervix:  3.5 cm
IMPRESSION: 1. Single live intrauterine pregnancy as above, estimated age 21
weeks and 6 days.
2. Limited visualization of the spine due to fetal position.
Otherwise unremarkable anatomic survey. No fetal anomalies detected.
Short interval follow-up could be performed to complete the
assessment of the fetal spine.

## 2020-07-27 ENCOUNTER — Other Ambulatory Visit: Payer: Self-pay | Admitting: Obstetrics and Gynecology

## 2020-07-27 DIAGNOSIS — Z3402 Encounter for supervision of normal first pregnancy, second trimester: Secondary | ICD-10-CM

## 2020-07-30 ENCOUNTER — Encounter: Payer: Medicaid Other | Admitting: Obstetrics and Gynecology

## 2020-08-17 ENCOUNTER — Ambulatory Visit: Payer: Medicaid Other

## 2020-09-30 DIAGNOSIS — Z3403 Encounter for supervision of normal first pregnancy, third trimester: Secondary | ICD-10-CM | POA: Insufficient documentation

## 2020-11-29 DIAGNOSIS — Z3A40 40 weeks gestation of pregnancy: Secondary | ICD-10-CM | POA: Insufficient documentation

## 2021-07-14 ENCOUNTER — Ambulatory Visit
Admission: EM | Admit: 2021-07-14 | Discharge: 2021-07-14 | Disposition: A | Discharge status: Home / Self Care | Payer: Medicaid Other | Attending: Emergency Medicine | Admitting: Emergency Medicine

## 2021-07-14 ENCOUNTER — Other Ambulatory Visit: Payer: Self-pay

## 2021-07-14 DIAGNOSIS — B9689 Other specified bacterial agents as the cause of diseases classified elsewhere: Secondary | ICD-10-CM | POA: Insufficient documentation

## 2021-07-14 DIAGNOSIS — N939 Abnormal uterine and vaginal bleeding, unspecified: Secondary | ICD-10-CM | POA: Insufficient documentation

## 2021-07-14 DIAGNOSIS — N760 Acute vaginitis: Secondary | ICD-10-CM | POA: Diagnosis not present

## 2021-07-14 DIAGNOSIS — B3731 Acute candidiasis of vulva and vagina: Secondary | ICD-10-CM | POA: Diagnosis not present

## 2021-07-14 LAB — CBC WITH DIFFERENTIAL/PLATELET
Abs Immature Granulocytes: 0.02 10*3/uL (ref 0.00–0.07)
Basophils Absolute: 0.1 10*3/uL (ref 0.0–0.1)
Basophils Relative: 1 %
Eosinophils Absolute: 0.5 10*3/uL (ref 0.0–0.5)
Eosinophils Relative: 5 %
HCT: 41 % (ref 36.0–46.0)
Hemoglobin: 13.7 g/dL (ref 12.0–15.0)
Immature Granulocytes: 0 %
Lymphocytes Relative: 26 %
Lymphs Abs: 2.5 10*3/uL (ref 0.7–4.0)
MCH: 27.7 pg (ref 26.0–34.0)
MCHC: 33.4 g/dL (ref 30.0–36.0)
MCV: 83 fL (ref 80.0–100.0)
Monocytes Absolute: 0.6 10*3/uL (ref 0.1–1.0)
Monocytes Relative: 6 %
Neutro Abs: 5.9 10*3/uL (ref 1.7–7.7)
Neutrophils Relative %: 62 %
Platelets: 320 10*3/uL (ref 150–400)
RBC: 4.94 MIL/uL (ref 3.87–5.11)
RDW: 12.9 % (ref 11.5–15.5)
WBC: 9.6 10*3/uL (ref 4.0–10.5)
nRBC: 0 % (ref 0.0–0.2)

## 2021-07-14 LAB — WET PREP, GENITAL
Sperm: NONE SEEN
Trich, Wet Prep: NONE SEEN
WBC, Wet Prep HPF POC: <10 (ref <10)

## 2021-07-14 LAB — PREGNANCY, URINE: Preg Test, Ur: NEGATIVE

## 2021-07-14 MED ORDER — NAPROXEN 500 MG PO TABS: 500.0000 mg | ORAL_TABLET | Freq: Two times a day (BID) | ORAL | 0 refills | Status: AC

## 2021-07-14 MED ORDER — FLUCONAZOLE 150 MG PO TABS: 150.0000 mg | ORAL_TABLET | Freq: Once | ORAL | 1 refills | Status: AC

## 2021-07-14 NOTE — Discharge Instructions (Addendum)
Your wet prep came back positive for yeast in addition to BV.  I am sending you home with Diflucan to treat the yeast infection.  Finish the Flagyl, even if you feel better.  Your hemoglobin was stable at 13.7.  Take the Naprosyn, this will help with pain and slow down the bleeding.  Please follow-up with your primary care provider if not getting better in several days ?

## 2021-07-14 NOTE — ED Triage Notes (Signed)
Patient presents to [facility] for vaginal bleeding and blood clots since Saturday. Reports some abdominal cramping yesterday. She states this started after use of pre probiotic treatment that she uses for BV. Pt states she was also prescribed flagyl for BV 14 day treatment.  ? ?Denies urinary symptoms. ?

## 2021-07-14 NOTE — ED Provider Notes (Signed)
HPI ? ?SUBJECTIVE: ? ?Suzanne Wang is a 21 y.o. female who presents with 4 days of brown vaginal bleeding, passing tissue and clots.  States that she is going through 3 thin pads that are meant for spotty per hour.  She states symptoms started after accidentally putting a probiotic capsule in her vagina instead of a boric acid suppository.  She reports minutes long, irregular, nonradiating, nonmigratory low midline abdominal cramping that feels like period cramps.  No nausea, vomiting, fevers vaginal odor, rash, itching, urinary complaints. She states that she has not been sexually active since her diagnosis of chlamydia on 2/13.  She states that she finished the week of doxycycline.  Her gonorrhea, HIV, syphilis, trichomonas testing was negative.  She was diagnosed with BV later due to vaginal odor.  She is currently on day 4 out of 14 of Flagyl for BV.  Patient has a past medical history of chlamydia, BV.  No history of gonorrhea, HIV, HSV, syphilis, trichomonas, vaginal yeast infections, diabetes, PID, ectopic pregnancies, TOA, ovarian torsion, ovarian cyst, irregular vaginal bleeding, coagulopathy.Marland Kitchen  LMP: 1/21.  She is on continuous OCPs.  She is not breast-feeding.  She denies possibility being pregnant.  PCP: [facility].  ? ? ?Past Medical History:  ?Diagnosis Date  ? No pertinent past medical history   ? ? ?Past Surgical History:  ?Procedure Laterality Date  ? NO PAST SURGERIES    ? ? ?Family History  ?Problem Relation Age of Onset  ? Hypertension Neg Hx   ? Diabetes Neg Hx   ? Cancer Neg Hx   ? ? ?Social History  ? ?Tobacco Use  ? Smoking status: Never  ? Smokeless tobacco: Never  ?Substance Use Topics  ? Alcohol use: Never  ? Drug use: Never  ? ? ?No current facility-administered medications for this encounter. ? ?Current Outpatient Medications:  ?  fluconazole (DIFLUCAN) 150 MG tablet, Take 1 tablet (150 mg total) by mouth once for 1 dose. 1 tab po x 1. May repeat in 72 hours if no improvement,  Disp: 2 tablet, Rfl: 1 ?  naproxen (NAPROSYN) 500 MG tablet, Take 1 tablet (500 mg total) by mouth 2 (two) times daily for 5 days., Disp: 10 tablet, Rfl: 0 ?  ELLA 30 MG tablet, Take 1 tablet by mouth once., Disp: , Rfl:  ?  Prenatal Vit-Fe Fumarate-FA (PRENATAL VITAMINS PO), Take by mouth., Disp: , Rfl:  ? ?No Known Allergies ? ? ?ROS ? ?As noted in HPI.  ? ?Physical Exam ? ?BP 126/72 (BP Location: Left Arm)   Pulse 79   Temp 98.1 ?F (36.7 ?C) (Oral)   Resp 16   SpO2 99%   Breastfeeding No  ? ?Constitutional: Well developed, well nourished, no acute distress ?Eyes:  EOMI, conjunctiva normal bilaterally ?HENT: Normocephalic, atraumatic,mucus membranes moist ?Respiratory: Normal inspiratory effort ?Cardiovascular: Normal rate ?GI: nondistended soft, nontender. No suprapubic, flank tenderness  ?back: No CVA tenderness ?GU: External genitalia normal.  Normal vaginal mucosa.  Normal parous os.  Brownish vaginal bleeding/material in the vaginal vault.  Scant blood Tinged mucus coming from the os.  No active bleeding.Marland Kitchen  Uterus smooth, NT. No CMT. No  adnexal tenderness. No adnexal masses.  Chaperone present during exam ?skin: No rash, skin intact ?Musculoskeletal: no deformities ?Neurologic: Alert & oriented x 3, no focal neuro deficits ?Psychiatric: Speech and behavior appropriate ? ? ?ED Course ? ? ?Medications - No data to display ? ?Orders Placed This Encounter  ?Procedures  ? Pelvic  exam  ?  Standing Status:   Standing  ?  Number of Occurrences:   1  ? Wet prep, genital  ?  Standing Status:   Standing  ?  Number of Occurrences:   1  ? Pregnancy, urine  ?  Standing Status:   Standing  ?  Number of Occurrences:   1  ? CBC with Differential  ?  Standing Status:   Standing  ?  Number of Occurrences:   1  ? ? ?Results for orders placed or performed during the hospital encounter of 07/14/21 (from the past 24 hour(s))  ?Pregnancy, urine     Status: None  ? Collection Time: 07/14/21  2:08 PM  ?Result Value Ref Range  ?  Preg Test, Ur NEGATIVE NEGATIVE  ?Wet prep, genital     Status: Abnormal  ? Collection Time: 07/14/21  2:08 PM  ?Result Value Ref Range  ? Yeast Wet Prep HPF POC PRESENT (A) NONE SEEN  ? Trich, Wet Prep NONE SEEN NONE SEEN  ? Clue Cells Wet Prep HPF POC PRESENT (A) NONE SEEN  ? WBC, Wet Prep HPF POC <10 <10  ? Sperm NONE SEEN   ?CBC with Differential     Status: None  ? Collection Time: 07/14/21  2:08 PM  ?Result Value Ref Range  ? WBC 9.6 4.0 - 10.5 K/uL  ? RBC 4.94 3.87 - 5.11 MIL/uL  ? Hemoglobin 13.7 12.0 - 15.0 g/dL  ? HCT 41.0 36.0 - 46.0 %  ? MCV 83.0 80.0 - 100.0 fL  ? MCH 27.7 26.0 - 34.0 pg  ? MCHC 33.4 30.0 - 36.0 g/dL  ? RDW 12.9 11.5 - 15.5 %  ? Platelets 320 150 - 400 K/uL  ? nRBC 0.0 0.0 - 0.2 %  ? Neutrophils Relative % 62 %  ? Neutro Abs 5.9 1.7 - 7.7 K/uL  ? Lymphocytes Relative 26 %  ? Lymphs Abs 2.5 0.7 - 4.0 K/uL  ? Monocytes Relative 6 %  ? Monocytes Absolute 0.6 0.1 - 1.0 K/uL  ? Eosinophils Relative 5 %  ? Eosinophils Absolute 0.5 0.0 - 0.5 K/uL  ? Basophils Relative 1 %  ? Basophils Absolute 0.1 0.0 - 0.1 K/uL  ? Immature Granulocytes 0 %  ? Abs Immature Granulocytes 0.02 0.00 - 0.07 K/uL  ? ?No results found. ? ?ED Clinical Impression ? ?1. Abnormal vaginal bleeding   ?2. BV (bacterial vaginosis)   ?3. Vaginal yeast infection   ? ? ? ?ED Assessment/Plan ? ?Will not recheck for gonorrhea and chlamydia given that she completed therapy for chlamydia and has not been sexually active since a diagnosis of chlamydia. ? ?Checking urine pregnancy, CBC, wet prep. ? ?Hemoglobin stable at 13.7.  She is not pregnant.  She has BV and yeast.  Will send home with Diflucan and have her continue the Flagyl.  Will send home with Naprosyn.  Suspect breakthrough vaginal bleeding from the continuous OCPs.  Because it is not a lot of bleeding, with holding Megace for now.  She will follow-up with her primary care provider if not getting any better.  ER return precautions given.  Discussed labs, MDM, plan and  followup with patient. Pt agrees with plan.  ? ?Meds ordered this encounter  ?Medications  ? naproxen (NAPROSYN) 500 MG tablet  ?  Sig: Take 1 tablet (500 mg total) by mouth 2 (two) times daily for 5 days.  ?  Dispense:  10 tablet  ?  Refill:  0  ? fluconazole (DIFLUCAN) 150 MG tablet  ?  Sig: Take 1 tablet (150 mg total) by mouth once for 1 dose. 1 tab po x 1. May repeat in 72 hours if no improvement  ?  Dispense:  2 tablet  ?  Refill:  1  ? ? ?*This clinic note was created using [employer]. Therefore, there may be occasional mistakes despite careful proofreading. ? ?? ? ? ?  ?Melynda Ripple, MD ?07/14/21 1457 ? ?

## 2022-03-17 ENCOUNTER — Ambulatory Visit
Admission: EM | Admit: 2022-03-17 | Discharge: 2022-03-17 | Disposition: A | Discharge status: Home / Self Care | Payer: Medicaid Other | Attending: Emergency Medicine | Admitting: Emergency Medicine

## 2022-03-17 DIAGNOSIS — H109 Unspecified conjunctivitis: Secondary | ICD-10-CM

## 2022-03-17 DIAGNOSIS — B9689 Other specified bacterial agents as the cause of diseases classified elsewhere: Secondary | ICD-10-CM | POA: Diagnosis not present

## 2022-03-17 MED ORDER — ALBUTEROL SULFATE HFA 108 (90 BASE) MCG/ACT IN AERS: 2.0000 | INHALATION_SPRAY | Freq: Four times a day (QID) | RESPIRATORY_TRACT | 2 refills | Status: AC | PRN

## 2022-03-17 MED ORDER — MOXIFLOXACIN HCL 0.5 % OP SOLN: 1.0000 [drp] | Freq: Three times a day (TID) | OPHTHALMIC | 0 refills | Status: DC

## 2022-03-17 NOTE — ED Triage Notes (Signed)
Pt states that she put dry contacts eye drops in her eyes yesterday.  Pt does not wear contacts or glasses.  Pt states that she failed her driving test and was recommended to wear glasses. Pt does not have her glasses with her.  Pt states she has woke up with puffiness and redness in both eyes. Pt states that the swelling is the same.  Pt denies any pain.  Pt has not flushed her eyes out and states she only used a cold rag placed over her eyes.  Pt asks for a medication refill for an albuterol inhaler. Pt states that she has asthma.

## 2022-03-17 NOTE — Discharge Instructions (Addendum)
Today you being treated for bacterial conjunctivitis.   Place one drop of moxifloxacin into the effected eye every 8 hours while awake for 7 days. If the other eye starts to have symptoms you may use medication in it as well. Do not allow tip of dropper to touch eye.  May use cool compress for comfort and to remove discharge if present. Pat the eye, do not wipe.  If wearing contacts, dispose of current pair. Wear glasses until symptoms have resolved.   Do not rub eyes, this may cause more irritation.  May use benadryl as needed to help if itching present.  Please avoid use of eye makeup until symptoms clear.  If symptoms persist after use of medication, please follow up at Urgent Care or with ophthalmologist (eye doctor)

## 2022-03-17 NOTE — ED Provider Notes (Signed)
[facility]    CSN: [id number] Arrival date & time: 03/17/22  1016      History   Chief Complaint Chief Complaint  Patient presents with   Eye Problem   Medication Refill    HPI Suzanne Wang is a 21 y.o. female.   Patient presents with eye drainage primarily in the morning, increased tearing, bilateral eye redness, swelling and a burning sensation for 2 days,.attempted use of an over-the-counter eyedrop which has been ineffective.  Denies pruritus, blurred vision or light sensitivity.  No known sick contacts.  Denies use of contacts or glasses currently.,   Past Medical History:  Diagnosis Date   No pertinent past medical history     Patient Active Problem List   Diagnosis Date Noted   Late prenatal care affecting pregnancy in second trimester 06/04/2020    Past Surgical History:  Procedure Laterality Date   NO PAST SURGERIES      OB History     Gravida  1   Para      Term      Preterm      AB      Living         SAB      IAB      Ectopic      Multiple      Live Births               Home Medications    Prior to Admission medications   Medication Sig Start Date End Date Taking? Authorizing Provider  ELLA 30 MG tablet Take 1 tablet by mouth once. 06/14/21  Yes [provider]  Prenatal Vit-Fe Fumarate-FA (PRENATAL VITAMINS PO) Take by mouth.   Yes [provider]    Family History Family History  Problem Relation Age of Onset   Hypertension Neg Hx    Diabetes Neg Hx    Cancer Neg Hx     Social History Social History   Tobacco Use   Smoking status: Never   Smokeless tobacco: Never  Vaping Use   Vaping Use: Never used  Substance Use Topics   Alcohol use: Never   Drug use: Never     Allergies   Patient has no known allergies.   Review of Systems Review of Systems  Constitutional: Negative.   Eyes:  Positive for pain, discharge and redness. Negative for photophobia, itching and visual  disturbance.  Respiratory: Negative.    Cardiovascular: Negative.      Physical Exam Triage Vital Signs ED Triage Vitals  Enc Vitals Group     BP 03/17/22 1037 110/78     Pulse Rate 03/17/22 1037 87     Resp 03/17/22 1037 18     Temp 03/17/22 1037 99.1 F (37.3 C)     Temp Source 03/17/22 1037 Oral     SpO2 03/17/22 1037 100 %     Weight 03/17/22 1035 170 lb (77.1 kg)     Height 03/17/22 1035 5\' 4"  (1.626 m)     Head Circumference --      Peak Flow --      Pain Score 03/17/22 1033 3     Pain Loc --      Pain Edu? --      Excl. in GC? --    No data found.  Updated Vital Signs BP 110/78 (BP Location: Left Arm)   Pulse 87   Temp 99.1 F (37.3 C) (Oral)   Resp 18  Ht 5\' 4"  (1.626 m)   Wt 170 lb (77.1 kg)   LMP 02/09/2022   SpO2 100%   BMI 29.18 kg/m   Visual Acuity Right Eye Distance:   Left Eye Distance:   Bilateral Distance:    Right Eye Near:   Left Eye Near:    Bilateral Near:     Physical Exam Constitutional:      Appearance: Normal appearance.  HENT:     Head: Normocephalic.  Eyes:     Comments: Present to the bilateral conjunctiva with bilateral mild to moderate periorbital swelling, no drainage noted on exam, vision is grossly intact, extraocular movements intact  Pulmonary:     Effort: Pulmonary effort is normal.  Neurological:     Mental Status: She is alert and oriented to person, place, and time. Mental status is at baseline.      UC Treatments / Results  Labs (all labs ordered are listed, but only abnormal results are displayed) Labs Reviewed - No data to display  EKG   Radiology No results found.  Procedures Procedures (including critical care time)  Medications Ordered in UC Medications - No data to display  Initial Impression / Assessment and Plan / UC Course  I have reviewed the triage vital signs and the nursing notes.  Pertinent labs & imaging results that were available during my care of the patient were reviewed by  me and considered in my medical decision making (see chart for details).  Bacterial conjunctivitis of both eyes  Presentation and symptomology is consistent with above diagnosis, discussed with patient, moxifloxacin prescribed, discussed administration, recommended avoidance of eye rubbing and touching to prevent.  There is a contamination and spread, may use over-the-counter analgesics, antihistamines and cool to warm compresses as needed for additional support, advise follow-up with urgent care or ophthalmologist if symptoms persist or worsen, requesting refill on albuterol inhaler due to change in weather, obliged Final Clinical Impressions(s) / UC Diagnoses   Final diagnoses:  None   Discharge Instructions   None    ED Prescriptions   None    PDMP not reviewed this encounter.   04/11/2022, NP 03/17/22 [phone]

## 2022-04-12 ENCOUNTER — Ambulatory Visit
Admission: EM | Admit: 2022-04-12 | Discharge: 2022-04-12 | Disposition: A | Discharge status: Home / Self Care | Payer: Medicaid Other | Attending: Emergency Medicine | Admitting: Emergency Medicine

## 2022-04-12 DIAGNOSIS — R590 Localized enlarged lymph nodes: Secondary | ICD-10-CM | POA: Insufficient documentation

## 2022-04-12 LAB — MONONUCLEOSIS SCREEN: Mono Screen: NEGATIVE

## 2022-04-12 LAB — CBC WITH DIFFERENTIAL/PLATELET
Abs Immature Granulocytes: 0.02 10*3/uL (ref 0.00–0.07)
Basophils Absolute: 0.1 10*3/uL (ref 0.0–0.1)
Basophils Relative: 1 %
Eosinophils Absolute: 0.5 10*3/uL (ref 0.0–0.5)
Eosinophils Relative: 6 %
HCT: 40.8 % (ref 36.0–46.0)
Hemoglobin: 13.6 g/dL (ref 12.0–15.0)
Immature Granulocytes: 0 %
Lymphocytes Relative: 28 %
Lymphs Abs: 2.4 10*3/uL (ref 0.7–4.0)
MCH: 27.6 pg (ref 26.0–34.0)
MCHC: 33.3 g/dL (ref 30.0–36.0)
MCV: 82.8 fL (ref 80.0–100.0)
Monocytes Absolute: 0.7 10*3/uL (ref 0.1–1.0)
Monocytes Relative: 8 %
Neutro Abs: 4.9 10*3/uL (ref 1.7–7.7)
Neutrophils Relative %: 57 %
Platelets: 290 10*3/uL (ref 150–400)
RBC: 4.93 MIL/uL (ref 3.87–5.11)
RDW: 13.2 % (ref 11.5–15.5)
WBC: 8.5 10*3/uL (ref 4.0–10.5)
nRBC: 0 % (ref 0.0–0.2)

## 2022-04-12 LAB — GROUP A STREP BY PCR: Group A Strep by PCR: NOT DETECTED

## 2022-04-12 MED ORDER — IBUPROFEN 600 MG PO TABS: 600.0000 mg | ORAL_TABLET | Freq: Four times a day (QID) | ORAL | 0 refills | Status: AC | PRN

## 2022-04-12 NOTE — ED Triage Notes (Signed)
Patient presents to UC for swollen lymph nodes and neck pain since 2 days ago. States she had strep throat last week. Treating pain with tylenol.

## 2022-04-12 NOTE — Discharge Instructions (Addendum)
600 mg of ibuprofen with 1000 mg of Tylenol 3 times a day.  You can take 5 mL of children's liquid Benadryl mixed with 5 mL of Maalox 3-4 times a day for sore throat.  Your strep, mono were negative.  Your CBC was normal.  I have placed a referral to ENT for you to be seen in 2 weeks if you are still having symptoms.  Here is a list of primary care providers who are taking new patients:  [facility] [place] Dr. Joseph Berkshire (sports medicine) Dr. Elizabeth Sauer [address] [place] [address] [phone]  [facility] [address] [place], [place] [address] [phone]  [facility] [place] [address]  [place] [address]  [phone]  [facility] [address] [place], [place] [address] [phone]  [facility] [address] [place]  [phone] [place], [place] [address]  Here are clinics/ other resources who will see you if you do not have insurance. Some have certain criteria that you must meet. Call them and find out what they are:  [facility]: [address] [place], [place] [address] Phone: [phone] Hours: First and Third Saturdays of each Month, 9 a.m. - 1 p.m.  [facility]: [address], [address] [place], [place] [address] Phone: [phone] Hours: Tuesday, 4 p.m. - 8 p.m. Thursday, 1 p.m. - 8 p.m. Wednesday, 9 a.m. - [facility] [address], [place], [place] [address] Phone: [phone] Pharmacy Phone Number: [phone] Dental Phone Number: [phone] [facility] Insurance Help: [phone]  Dental Hours: Monday - Thursday, 8 a.m. - 6 p.m.  [facility] [address] [place], [place] [address] Phone: [phone] Pharmacy Phone Number: [phone] [facility] Insurance Help: [phone]  [facility] [address] [place] [place], [place] [address] Phone: [phone] Pharmacy Phone Number: [phone] [facility]  Insurance Help: [phone]  [facility] [address] [place], [place] [address] Phone: [phone] [facility] Insurance Help: [phone]   [facility] [address] [place], [place] [address] Phone: [phone]  Go to www.goodrx.com  or www.costplusdrugs.com to look up your medications. This will give you a list of where you can find your prescriptions at the most affordable prices. Or ask the pharmacist what the cash price is, or if they have any other discount programs available to help make your medication more affordable. This can be less expensive than what you would pay with insurance.

## 2022-04-12 NOTE — ED Provider Notes (Signed)
HPI  SUBJECTIVE:  Suzanne Wang is a 21 y.o. female who presents with 2 days of tender, swollen cervical lymphadenopathy, worse on the right and pain with swallowing.  No fevers, nasal congestion, rhinorrhea, dental pain, ear pain, drooling, trismus, neck stiffness, voice changes, sensation of throat swelling shut, abdominal pain, rash.  She states that she had a sore throat last week with "white spots" which has resolved except for persistent pain with swallowing.  She did not seek medical attention for this.  She has otherwise been in her usual state of health.  No tender masses elsewhere.  No known strep, COVID, flu, mono exposure.  No antibiotics in the past month.  No antipyretic in the past 6 hours.  She tried Tylenol at night without improvement in her symptoms.  Symptoms are worse with swelling.  She has no past medical history.  LMP: November 15.  He denies possibility being pregnant.  PCP: None.    Past Medical History:  Diagnosis Date   No pertinent past medical history     Past Surgical History:  Procedure Laterality Date   NO PAST SURGERIES      Family History  Problem Relation Age of Onset   Hypertension Neg Hx    Diabetes Neg Hx    Cancer Neg Hx     Social History   Tobacco Use   Smoking status: Never   Smokeless tobacco: Never  Vaping Use   Vaping Use: Never used  Substance Use Topics   Alcohol use: Never   Drug use: Never    No current facility-administered medications for this encounter.  Current Outpatient Medications:    albuterol (VENTOLIN HFA) 108 (90 Base) MCG/ACT inhaler, Inhale 2 puffs into the lungs every 6 (six) hours as needed for wheezing or shortness of breath., Disp: 8 g, Rfl: 2   ELLA 30 MG tablet, Take 1 tablet by mouth once., Disp: , Rfl:    moxifloxacin (VIGAMOX) 0.5 % ophthalmic solution, Place 1 drop into both eyes 3 (three) times daily., Disp: 3 mL, Rfl: 0   Prenatal Vit-Fe Fumarate-FA (PRENATAL VITAMINS PO), Take by mouth., Disp: ,  Rfl:   No Known Allergies   ROS  As noted in HPI.   Physical Exam  BP 111/74 (BP Location: Right Arm)   Pulse 79   Temp 98 F (36.7 C) (Oral)   Resp 16   LMP 03/16/2022   SpO2 100%   Constitutional: Well developed, well nourished, no acute distress Eyes:  EOMI, conjunctiva normal bilaterally HENT: Normocephalic, atraumatic,mucus membranes moist.  Normal dentition.  Slightly erythematous oropharynx.  Tonsils normal size without exudates.  No drooling, trismus, muffled voice. Neck: Positive anterior cervical lymphadenopathy, worse on the right.  No appreciable posterior cervical lymphadenopathy Respiratory: Normal inspiratory effort Cardiovascular: Normal rate rhythm, no murmur GI: nondistended soft, nontender, no splenomegaly skin: No rash, skin intact Musculoskeletal: no deformities Neurologic: Alert & oriented x 3, no focal neuro deficits Psychiatric: Speech and behavior appropriate   ED Course   Medications - No data to display  Orders Placed This Encounter  Procedures   Group A Strep by PCR    Standing Status:   Standing    Number of Occurrences:   1    Order Specific Question:   Patient immune status    Answer:   Normal   CBC with Differential    Standing Status:   Standing    Number of Occurrences:   1   Mononucleosis screen  Standing Status:   Standing    Number of Occurrences:   1    Results for orders placed or performed during the hospital encounter of 04/12/22 (from the past 24 hour(s))  Group A Strep by PCR     Status: None   Collection Time: 04/12/22  6:11 PM   Specimen: Throat; Sterile Swab  Result Value Ref Range   Group A Strep by PCR NOT DETECTED NOT DETECTED  CBC with Differential     Status: None   Collection Time: 04/12/22  7:23 PM  Result Value Ref Range   WBC 8.5 4.0 - 10.5 K/uL   RBC 4.93 3.87 - 5.11 MIL/uL   Hemoglobin 13.6 12.0 - 15.0 g/dL   HCT 52.8 41.3 - 24.4 %   MCV 82.8 80.0 - 100.0 fL   MCH 27.6 26.0 - 34.0 pg   MCHC  33.3 30.0 - 36.0 g/dL   RDW 01.0 27.2 - 53.6 %   Platelets 290 150 - 400 K/uL   nRBC 0.0 0.0 - 0.2 %   Neutrophils Relative % 57 %   Neutro Abs 4.9 1.7 - 7.7 K/uL   Lymphocytes Relative 28 %   Lymphs Abs 2.4 0.7 - 4.0 K/uL   Monocytes Relative 8 %   Monocytes Absolute 0.7 0.1 - 1.0 K/uL   Eosinophils Relative 6 %   Eosinophils Absolute 0.5 0.0 - 0.5 K/uL   Basophils Relative 1 %   Basophils Absolute 0.1 0.0 - 0.1 K/uL   Immature Granulocytes 0 %   Abs Immature Granulocytes 0.02 0.00 - 0.07 K/uL  Mononucleosis screen     Status: None   Collection Time: 04/12/22  7:23 PM  Result Value Ref Range   Mono Screen NEGATIVE NEGATIVE   No results found.  ED Clinical Impression  1. Anterior cervical lymphadenopathy      ED Assessment/Plan     Strep PCR negative.  Will check CBC and mono.  No evidence of a deep space infection, dental infection.  If workup here is negative, will refer to ENT.  Will provide primary care list for ongoing care.  CBC normal, mono negative.  Will refer to ENT if still symptomatic in a week or 2.  Benadryl/Maalox mixture, Tylenol/ibuprofen.  Discussed labs, MDM, treatment plan, and plan for follow-up with patient. Discussed sn/sx that should prompt return to the ED. patient agrees with plan.   No orders of the defined types were placed in this encounter.     *This clinic note was created using Dragon dictation software. Therefore, there may be occasional mistakes despite careful proofreading.  ?    Domenick Gong, MD 04/12/22 2122

## 2022-04-26 ENCOUNTER — Ambulatory Visit
Admission: EM | Admit: 2022-04-26 | Discharge: 2022-04-26 | Discharge status: Against Medical Advice | Payer: Medicaid Other

## 2022-04-26 ENCOUNTER — Ambulatory Visit: Admit: 2022-04-26 | Payer: Medicaid Other

## 2022-04-26 NOTE — ED Triage Notes (Addendum)
Patient presents for STD testing she denies any symptoms. Called patient no answer.

## 2022-04-27 ENCOUNTER — Ambulatory Visit: Payer: Medicaid Other

## 2022-04-27 ENCOUNTER — Ambulatory Visit: Admit: 2022-04-27 | Discharge status: Against Medical Advice | Payer: Medicaid Other

## 2022-08-12 ENCOUNTER — Ambulatory Visit: Payer: Self-pay

## 2022-08-26 ENCOUNTER — Inpatient Hospital Stay: Admission: RE | Admit: 2022-08-26 | Payer: Self-pay | Source: Ambulatory Visit

## 2022-08-27 ENCOUNTER — Ambulatory Visit: Payer: Self-pay

## 2022-08-27 ENCOUNTER — Ambulatory Visit
Admission: RE | Admit: 2022-08-27 | Discharge: 2022-08-27 | Disposition: A | Discharge status: Home / Self Care | Payer: Medicaid Other | Source: Ambulatory Visit | Attending: Urgent Care | Admitting: Urgent Care

## 2022-08-27 VITALS — BP 109/76 | HR 88 | Temp 97.8°F | Resp 16

## 2022-08-27 DIAGNOSIS — Z113 Encounter for screening for infections with a predominantly sexual mode of transmission: Secondary | ICD-10-CM | POA: Diagnosis not present

## 2022-08-27 DIAGNOSIS — N926 Irregular menstruation, unspecified: Secondary | ICD-10-CM

## 2022-08-27 LAB — POCT URINALYSIS DIP (MANUAL ENTRY)
Bilirubin, UA: NEGATIVE
Glucose, UA: NEGATIVE mg/dL
Ketones, POC UA: NEGATIVE mg/dL
Nitrite, UA: POSITIVE — AB
Spec Grav, UA: 1.025 (ref 1.010–1.025)
Urobilinogen, UA: 0.2 E.U./dL
pH, UA: 7 (ref 5.0–8.0)

## 2022-08-27 LAB — POCT URINE PREGNANCY: Preg Test, Ur: NEGATIVE

## 2022-08-27 NOTE — ED Provider Notes (Signed)
Suzanne Wang    CSN: [id number] Arrival date & time: 08/27/22  1239      History   Chief Complaint Chief Complaint  Patient presents with   Vaginal Bleeding    Entered by patient   Abdominal Pain    HPI Suzanne Wang is a 22 y.o. female.    Vaginal Bleeding Associated symptoms: abdominal pain   Abdominal Pain Associated symptoms: vaginal bleeding     Presents to urgent care with complaint of abnormal vaginal bleeding x 3+ weeks.  Patient endorses abnormal menses which short cycle for several months.  She states she had a cycle in March that lasted 2 days and then another 4/8, lasting 2 days, then bleeding started again on about 418.  She states bleeding is heavy and productive of "jelly like" blood clots.  Patient states she does not believe that she is at risk of sexually transmitted infections, she is monogamous with her partner, but then uncertain if he is also monogamous.  She states that he was recently tested and was negative for infection.  She is requesting testing today.  She stopped taking oral contraception a few months ago.  She endorses breakthrough bleeding while taking.  Past Medical History:  Diagnosis Date   No pertinent past medical history     Patient Active Problem List   Diagnosis Date Noted   [redacted] weeks gestation of pregnancy 11/29/2020   Normal spontaneous vaginal delivery 11/28/2020   Encounter for supervision of normal first pregnancy in third trimester 09/30/2020   Late prenatal care affecting pregnancy in second trimester 06/04/2020   Asthma complicating pregnancy, antepartum 01/20/2012    Past Surgical History:  Procedure Laterality Date   NO PAST SURGERIES      OB History     Gravida  1   Para      Term      Preterm      AB      Living         SAB      IAB      Ectopic      Multiple      Live Births               Home Medications    Prior to Admission medications   Medication Sig Start Date End  Date Taking? Authorizing Provider  albuterol (VENTOLIN HFA) 108 (90 Base) MCG/ACT inhaler Inhale 2 puffs into the lungs every 6 (six) hours as needed for wheezing or shortness of breath. 03/17/22   White, Adrienne R, NP  ELLA 30 MG tablet Take 1 tablet by mouth once. 06/14/21   [provider]  ibuprofen (ADVIL) 600 MG tablet Take 1 tablet (600 mg total) by mouth every 6 (six) hours as needed. 04/12/22   Domenick Gong, MD  Prenatal Vit-Fe Fumarate-FA (PRENATAL VITAMINS PO) Take by mouth.    [provider]    Family History Family History  Problem Relation Age of Onset   Hypertension Neg Hx    Diabetes Neg Hx    Cancer Neg Hx     Social History Social History   Tobacco Use   Smoking status: Never   Smokeless tobacco: Never  Vaping Use   Vaping Use: Never used  Substance Use Topics   Alcohol use: Never   Drug use: Never     Allergies   Patient has no known allergies.   Review of Systems Review of Systems  Gastrointestinal:  Positive for abdominal pain.  Genitourinary:  Positive for vaginal bleeding.     Physical Exam Triage Vital Signs ED Triage Vitals  Enc Vitals Group     BP      Pulse      Resp      Temp      Temp src      SpO2      Weight      Height      Head Circumference      Peak Flow      Pain Score      Pain Loc      Pain Edu?      Excl. in GC?    No data found.  Updated Vital Signs There were no vitals taken for this visit.  Visual Acuity Right Eye Distance:   Left Eye Distance:   Bilateral Distance:    Right Eye Near:   Left Eye Near:    Bilateral Near:     Physical Exam Vitals reviewed.  Constitutional:      Appearance: She is well-developed.  Abdominal:     General: Bowel sounds are normal.     Tenderness: There is no abdominal tenderness. There is no right CVA tenderness or left CVA tenderness.  Skin:    General: Skin is warm and dry.  Neurological:     Mental Status: She is alert.      UC  Treatments / Results  Labs (all labs ordered are listed, but only abnormal results are displayed) Labs Reviewed  POCT URINE PREGNANCY  POCT URINALYSIS DIP (MANUAL ENTRY)  CERVICOVAGINAL ANCILLARY ONLY    EKG   Radiology No results found.  Procedures Procedures (including critical care time)  Medications Ordered in UC Medications - No data to display  Initial Impression / Assessment and Plan / UC Course  I have reviewed the triage vital signs and the nursing notes.  Pertinent labs & imaging results that were available during my care of the patient were reviewed by me and considered in my medical decision making (see chart for details).   Suzanne Wang is a 22 y.o. female presenting with heavy vaginal bleeding and irregular menses. Patient is afebrile without recent antipyretics, satting well on room air. Overall is well appearing and non-toxic, well hydrated, without respiratory distress. Pulmonary exam is unremarkable.  Normal active bowel sounds.  Abdomen is soft and nontender.  No CVA tenderness.  UA results are suggestive of urinary tract infection with moderate leukocytes and nitrites present.  Large blood may be a result of menses.  Will treat for UTI while awaiting results of vaginal swab and STD testing performed today.  Treating with cephalexin given she is not using contraception.  Counseled patient on potential for adverse effects with medications prescribed/recommended today, ER and return-to-clinic precautions discussed, patient verbalized understanding and agreement with care plan.    Final Clinical Impressions(s) / UC Diagnoses   Final diagnoses:  Abnormal menses   Discharge Instructions   None    ED Prescriptions   None    PDMP not reviewed this encounter.   Charma Igo, [place] 08/27/22 1404

## 2022-08-27 NOTE — Discharge Instructions (Addendum)
Please follow-up by scheduling a visit with a gynecology provider. :1}

## 2022-08-27 NOTE — ED Triage Notes (Signed)
Patient presents to UC for vaginal bleeding x 3 weeks. Abdominal pressure since last weekend. LMP on 04/08 lasted about 2 days, started back 04/18. Stopped ELLA BC. Req full STD work-up taking ibuprofen.

## 2022-08-29 LAB — CERVICOVAGINAL ANCILLARY ONLY
Bacterial Vaginitis (gardnerella): POSITIVE — AB
Candida Glabrata: NEGATIVE
Candida Vaginitis: POSITIVE — AB
Chlamydia: NEGATIVE
Comment: NEGATIVE
Comment: NEGATIVE
Comment: NEGATIVE
Comment: NEGATIVE
Comment: NEGATIVE
Comment: NORMAL
Neisseria Gonorrhea: NEGATIVE
Trichomonas: NEGATIVE

## 2022-08-29 LAB — RPR: RPR Ser Ql: NONREACTIVE

## 2022-08-29 LAB — HIV ANTIBODY (ROUTINE TESTING W REFLEX): HIV Screen 4th Generation wRfx: NONREACTIVE

## 2022-08-30 ENCOUNTER — Telehealth (HOSPITAL_COMMUNITY): Payer: Self-pay | Admitting: Emergency Medicine

## 2022-08-30 MED ORDER — FLUCONAZOLE 150 MG PO TABS: 150.0000 mg | ORAL_TABLET | Freq: Once | ORAL | 0 refills | Status: AC

## 2022-08-30 MED ORDER — METRONIDAZOLE 500 MG PO TABS: 500.0000 mg | ORAL_TABLET | Freq: Two times a day (BID) | ORAL | 0 refills | Status: DC

## 2022-10-12 ENCOUNTER — Ambulatory Visit: Payer: Self-pay

## 2024-04-28 ENCOUNTER — Encounter: Payer: Self-pay | Admitting: Emergency Medicine

## 2024-04-28 ENCOUNTER — Ambulatory Visit
Admission: EM | Admit: 2024-04-28 | Discharge: 2024-04-28 | Disposition: A | Discharge status: Home / Self Care | Attending: Emergency Medicine | Admitting: Emergency Medicine

## 2024-04-28 DIAGNOSIS — R6889 Other general symptoms and signs: Secondary | ICD-10-CM

## 2024-04-28 DIAGNOSIS — J069 Acute upper respiratory infection, unspecified: Secondary | ICD-10-CM

## 2024-04-28 LAB — POCT URINE PREGNANCY: Preg Test, Ur: NEGATIVE

## 2024-04-28 LAB — POCT INFLUENZA A/B
Influenza A, POC: NEGATIVE
Influenza B, POC: NEGATIVE

## 2024-04-28 MED ORDER — IPRATROPIUM BROMIDE 0.06 % NA SOLN: 2.0000 | Freq: Four times a day (QID) | NASAL | 12 refills | Status: AC

## 2024-04-28 MED ORDER — PROMETHAZINE-DM 6.25-15 MG/5ML PO SYRP: 5.0000 mL | ORAL_SOLUTION | Freq: Four times a day (QID) | ORAL | 0 refills | Status: AC | PRN

## 2024-04-28 MED ORDER — BENZONATATE 100 MG PO CAPS: 200.0000 mg | ORAL_CAPSULE | Freq: Three times a day (TID) | ORAL | 0 refills | Status: AC

## 2024-04-28 NOTE — ED Provider Notes (Signed)
 " [facility]    CSN: [id number] Arrival date & time: 04/28/24  1106      History   Chief Complaint Chief Complaint  Patient presents with   Sore Throat    HPI Breauna Mazzeo is a 23 y.o. female.   HPI  23 year old female with no significant past medical history presents for evaluation of respiratory symptoms that started 2 days ago and consist of headache, body aches, runny nose for clear nasal discharge, sore throat, and a productive cough for yellow sputum.  No fever, shortness breath, wheezing, or ear pain.  Her daughter has been sick with similar symptoms for 2 weeks.  Past Medical History:  Diagnosis Date   No pertinent past medical history     Patient Active Problem List   Diagnosis Date Noted   [redacted] weeks gestation of pregnancy 11/29/2020   Normal spontaneous vaginal delivery 11/28/2020   Encounter for supervision of normal first pregnancy in third trimester 09/30/2020   Late prenatal care affecting pregnancy in second trimester 06/04/2020   Asthma complicating pregnancy, antepartum 01/20/2012    Past Surgical History:  Procedure Laterality Date   NO PAST SURGERIES      OB History     Gravida  1   Para      Term      Preterm      AB      Living         SAB      IAB      Ectopic      Multiple      Live Births               Home Medications    Prior to Admission medications  Medication Sig Start Date End Date Taking? Authorizing Provider  benzonatate  (TESSALON ) 100 MG capsule Take 2 capsules (200 mg total) by mouth every 8 (eight) hours. 04/28/24  Yes Bernardino Ditch, NP  ipratropium (ATROVENT ) 0.06 % nasal spray Place 2 sprays into both nostrils 4 (four) times daily. 04/28/24  Yes Bernardino Ditch, NP  promethazine -dextromethorphan (PROMETHAZINE -DM) 6.25-15 MG/5ML syrup Take 5 mLs by mouth 4 (four) times daily as needed. 04/28/24  Yes Bernardino Ditch, NP  albuterol  (VENTOLIN  HFA) 108 (90 Base) MCG/ACT inhaler Inhale 2 puffs into  the lungs every 6 (six) hours as needed for wheezing or shortness of breath. 03/17/22   White, Adrienne R, NP  ELLA 30 MG tablet Take 1 tablet by mouth once. 06/14/21   [provider]  ibuprofen  (ADVIL ) 600 MG tablet Take 1 tablet (600 mg total) by mouth every 6 (six) hours as needed. 04/12/22   Van Knee, MD  Prenatal Vit-Fe Fumarate-FA (PRENATAL VITAMINS PO) Take by mouth.    [provider]    Family History Family History  Problem Relation Age of Onset   Hypertension Neg Hx    Diabetes Neg Hx    Cancer Neg Hx     Social History Social History[1]   Allergies   Patient has no known allergies.   Review of Systems Review of Systems  Constitutional:  Negative for fever.  HENT:  Positive for congestion, rhinorrhea and sore throat. Negative for ear pain.   Respiratory:  Positive for cough. Negative for shortness of breath and wheezing.   Musculoskeletal:  Positive for arthralgias and myalgias.  Neurological:  Positive for headaches.     Physical Exam Triage Vital Signs ED Triage Vitals  Encounter Vitals Group     BP  Girls Systolic BP Percentile      Girls Diastolic BP Percentile      Boys Systolic BP Percentile      Boys Diastolic BP Percentile      Pulse      Resp      Temp      Temp src      SpO2      Weight      Height      Head Circumference      Peak Flow      Pain Score      Pain Loc      Pain Education      Exclude from Growth Chart    No data found.  Updated Vital Signs BP 108/74 (BP Location: Left Arm)   Pulse 76   Temp 98.1 F (36.7 C) (Oral)   Resp 18   Wt 170 lb (77.1 kg)   LMP 03/27/2024   SpO2 100%   BMI 29.18 kg/m   Visual Acuity Right Eye Distance:   Left Eye Distance:   Bilateral Distance:    Right Eye Near:   Left Eye Near:    Bilateral Near:     Physical Exam Vitals and nursing note reviewed.  Constitutional:      Appearance: Normal appearance. She is not ill-appearing.  HENT:     Head:  Normocephalic and atraumatic.     Right Ear: Tympanic membrane, ear canal and external ear normal. There is no impacted cerumen.     Left Ear: Tympanic membrane, ear canal and external ear normal. There is no impacted cerumen.     Nose: Congestion and rhinorrhea present.     Comments: Nasal mucosa is erythematous and edematous with clear discharge in both nares.    Mouth/Throat:     Mouth: Mucous membranes are moist.     Pharynx: Oropharynx is clear. No oropharyngeal exudate or posterior oropharyngeal erythema.  Cardiovascular:     Rate and Rhythm: Normal rate and regular rhythm.     Pulses: Normal pulses.     Heart sounds: Normal heart sounds. No murmur heard.    No friction rub. No gallop.  Pulmonary:     Effort: Pulmonary effort is normal.     Breath sounds: Rhonchi present. No wheezing or rales.  Musculoskeletal:     Cervical back: Normal range of motion and neck supple. No tenderness.  Lymphadenopathy:     Cervical: No cervical adenopathy.  Skin:    General: Skin is warm and dry.     Capillary Refill: Capillary refill takes less than 2 seconds.     Findings: No rash.  Neurological:     General: No focal deficit present.     Mental Status: She is alert and oriented to person, place, and time.      UC Treatments / Results  Labs (all labs ordered are listed, but only abnormal results are displayed) Labs Reviewed  POCT INFLUENZA A/B - Normal  POCT URINE PREGNANCY - Normal    EKG   Radiology No results found.  Procedures Procedures (including critical care time)  Medications Ordered in UC Medications - No data to display  Initial Impression / Assessment and Plan / UC Course  I have reviewed the triage vital signs and the nursing notes.  Pertinent labs & imaging results that were available during my care of the patient were reviewed by me and considered in my medical decision making (see chart for details).   Patient is a  nontoxic-appearing 23 year old female  presenting for evaluation of 2 days with respiratory symptoms as outlined in HPI above.  Her physical exam does reveal inflammation of her nasal mucosa with clear nasal discharge.  Oropharyngeal exam is benign.  No cervical of adenopathy present.  Cardiopulmonary exam physical lung sounds in all fields.  Influenza antigen test and urine pregnancy test were ordered at triage given that patient is 2 days late on her menses.  Influenza antigen test was negative as was the urine pregnancy.  I will discharge patient home with diagnosis of viral URI with a cough.  Prescribe Atrovent  nasal spray for nasal congestion and Tessalon  Perles and Promethazine  DM cough syrup for cough and congestion.  Tylenol and/or ibuprofen  as needed for fever or pain.  Return precautions reviewed.   Final Clinical Impressions(s) / UC Diagnoses   Final diagnoses:  Influenza-like symptoms  Viral URI with cough     Discharge Instructions      Your testing today for influenza was negative.  Your exam is consistent with a viral respiratory infection.  Use over-the-counter Tylenol and/or ibuprofen  according to the package instructions as needed for any fever or pain.  Use the Atrovent  nasal spray, 2 squirts in each nostril every 6 hours, as needed for runny nose and postnasal drip.  Use the Tessalon  Perles every 8 hours during the day.  Take them with a small sip of water.  They may give you some numbness to the base of your tongue or a metallic taste in your mouth, this is normal.  Use the Promethazine  DM cough syrup at bedtime for cough and congestion.  It will make you drowsy so do not take it during the day.  Return for reevaluation or see your primary care provider for any new or worsening symptoms.      ED Prescriptions     Medication Sig Dispense Auth. Provider   benzonatate  (TESSALON ) 100 MG capsule Take 2 capsules (200 mg total) by mouth every 8 (eight) hours. 21 capsule Bernardino Ditch, NP   ipratropium  (ATROVENT ) 0.06 % nasal spray Place 2 sprays into both nostrils 4 (four) times daily. 15 mL Bernardino Ditch, NP   promethazine -dextromethorphan (PROMETHAZINE -DM) 6.25-15 MG/5ML syrup Take 5 mLs by mouth 4 (four) times daily as needed. 118 mL Bernardino Ditch, NP      PDMP not reviewed this encounter.    [1]  Social History Tobacco Use   Smoking status: Never   Smokeless tobacco: Never  Vaping Use   Vaping status: Never Used  Substance Use Topics   Alcohol use: Never   Drug use: Never     Bernardino Ditch, NP 04/28/24 1241  "

## 2024-04-28 NOTE — Discharge Instructions (Addendum)
 Your testing today for influenza was negative.  Your exam is consistent with a viral respiratory infection.  Use over-the-counter Tylenol and/or ibuprofen  according to the package instructions as needed for any fever or pain.  Use the Atrovent  nasal spray, 2 squirts in each nostril every 6 hours, as needed for runny nose and postnasal drip.  Use the Tessalon  Perles every 8 hours during the day.  Take them with a small sip of water.  They may give you some numbness to the base of your tongue or a metallic taste in your mouth, this is normal.  Use the Promethazine  DM cough syrup at bedtime for cough and congestion.  It will make you drowsy so do not take it during the day.  Return for reevaluation or see your primary care provider for any new or worsening symptoms.

## 2024-04-28 NOTE — ED Triage Notes (Signed)
 Sx 2 days  Sore throat Bodyaches Runny nose Headache
